# Patient Record
Sex: Male | Born: 1935 | Race: White | Hispanic: No | Smoking: Never smoker
Health system: Southern US, Community
[De-identification: ages and names within clinical notes are randomized; demographics above are authoritative.]

## PROBLEM LIST (undated history)

## (undated) DIAGNOSIS — I10 Essential (primary) hypertension: Secondary | ICD-10-CM

## (undated) DIAGNOSIS — E785 Hyperlipidemia, unspecified: Secondary | ICD-10-CM

## (undated) DIAGNOSIS — I255 Ischemic cardiomyopathy: Secondary | ICD-10-CM

## (undated) DIAGNOSIS — Z9289 Personal history of other medical treatment: Secondary | ICD-10-CM

## (undated) DIAGNOSIS — I2510 Atherosclerotic heart disease of native coronary artery without angina pectoris: Secondary | ICD-10-CM

## (undated) DIAGNOSIS — N189 Chronic kidney disease, unspecified: Secondary | ICD-10-CM

## (undated) DIAGNOSIS — D649 Anemia, unspecified: Secondary | ICD-10-CM

## (undated) DIAGNOSIS — M1990 Unspecified osteoarthritis, unspecified site: Secondary | ICD-10-CM

## (undated) DIAGNOSIS — R1310 Dysphagia, unspecified: Secondary | ICD-10-CM

## (undated) DIAGNOSIS — I340 Nonrheumatic mitral (valve) insufficiency: Secondary | ICD-10-CM

## (undated) DIAGNOSIS — R55 Syncope and collapse: Secondary | ICD-10-CM

## (undated) DIAGNOSIS — I351 Nonrheumatic aortic (valve) insufficiency: Secondary | ICD-10-CM

## (undated) HISTORY — DX: Ischemic cardiomyopathy: I25.5

## (undated) HISTORY — DX: Atherosclerotic heart disease of native coronary artery without angina pectoris: I25.10

## (undated) HISTORY — DX: Syncope and collapse: R55

## (undated) HISTORY — DX: Personal history of other medical treatment: Z92.89

## (undated) HISTORY — DX: Essential (primary) hypertension: I10

## (undated) HISTORY — DX: Anemia, unspecified: D64.9

## (undated) HISTORY — DX: Hyperlipidemia, unspecified: E78.5

## (undated) HISTORY — DX: Nonrheumatic mitral (valve) insufficiency: I34.0

## (undated) HISTORY — DX: Chronic kidney disease, unspecified: N18.9

## (undated) HISTORY — DX: Nonrheumatic aortic (valve) insufficiency: I35.1

## (undated) HISTORY — PX: INSERT / REPLACE / REMOVE PACEMAKER: SUR710

## (undated) HISTORY — DX: Unspecified osteoarthritis, unspecified site: M19.90

---

## 1978-11-16 HISTORY — PX: CHOLECYSTECTOMY: SHX55

## 1999-08-17 DIAGNOSIS — E785 Hyperlipidemia, unspecified: Secondary | ICD-10-CM

## 1999-08-17 HISTORY — DX: Hyperlipidemia, unspecified: E78.5

## 2001-09-16 DIAGNOSIS — M1990 Unspecified osteoarthritis, unspecified site: Secondary | ICD-10-CM

## 2001-09-16 HISTORY — DX: Unspecified osteoarthritis, unspecified site: M19.90

## 2002-10-16 DIAGNOSIS — I10 Essential (primary) hypertension: Secondary | ICD-10-CM

## 2002-10-16 HISTORY — DX: Essential (primary) hypertension: I10

## 2003-12-18 DIAGNOSIS — Z9289 Personal history of other medical treatment: Secondary | ICD-10-CM

## 2003-12-18 HISTORY — DX: Personal history of other medical treatment: Z92.89

## 2004-07-14 DIAGNOSIS — D649 Anemia, unspecified: Secondary | ICD-10-CM

## 2004-07-14 HISTORY — DX: Anemia, unspecified: D64.9

## 2004-09-25 ENCOUNTER — Ambulatory Visit: Payer: Self-pay | Admitting: Family Medicine

## 2004-10-07 ENCOUNTER — Ambulatory Visit: Payer: Self-pay | Admitting: Family Medicine

## 2004-11-27 ENCOUNTER — Ambulatory Visit: Payer: Self-pay | Admitting: Family Medicine

## 2004-12-17 LAB — HM COLONOSCOPY

## 2005-01-08 ENCOUNTER — Inpatient Hospital Stay: Payer: Self-pay | Admitting: Internal Medicine

## 2005-04-21 ENCOUNTER — Ambulatory Visit: Payer: Self-pay | Admitting: Family Medicine

## 2005-06-12 ENCOUNTER — Ambulatory Visit: Payer: Self-pay | Admitting: Family Medicine

## 2005-06-16 ENCOUNTER — Ambulatory Visit: Payer: Self-pay | Admitting: Family Medicine

## 2005-07-26 ENCOUNTER — Other Ambulatory Visit: Payer: Self-pay

## 2005-07-26 ENCOUNTER — Inpatient Hospital Stay: Payer: Self-pay | Admitting: Internal Medicine

## 2005-07-26 DIAGNOSIS — Z9289 Personal history of other medical treatment: Secondary | ICD-10-CM

## 2005-07-26 HISTORY — DX: Personal history of other medical treatment: Z92.89

## 2005-07-27 ENCOUNTER — Other Ambulatory Visit: Payer: Self-pay

## 2005-08-12 ENCOUNTER — Encounter: Payer: Self-pay | Admitting: Otolaryngology

## 2005-08-16 ENCOUNTER — Encounter: Payer: Self-pay | Admitting: Otolaryngology

## 2005-09-02 ENCOUNTER — Ambulatory Visit: Payer: Self-pay | Admitting: Family Medicine

## 2005-10-05 ENCOUNTER — Ambulatory Visit: Payer: Self-pay | Admitting: Family Medicine

## 2005-10-14 ENCOUNTER — Ambulatory Visit: Payer: Self-pay | Admitting: Family Medicine

## 2006-04-07 ENCOUNTER — Ambulatory Visit: Payer: Self-pay | Admitting: Family Medicine

## 2006-08-25 ENCOUNTER — Ambulatory Visit: Payer: Self-pay | Admitting: Family Medicine

## 2007-01-26 ENCOUNTER — Ambulatory Visit: Payer: Self-pay | Admitting: Internal Medicine

## 2007-08-26 ENCOUNTER — Ambulatory Visit: Payer: Self-pay | Admitting: Family Medicine

## 2007-11-03 ENCOUNTER — Telehealth (INDEPENDENT_AMBULATORY_CARE_PROVIDER_SITE_OTHER): Payer: Self-pay | Admitting: *Deleted

## 2008-08-21 ENCOUNTER — Ambulatory Visit: Payer: Self-pay | Admitting: Family Medicine

## 2008-10-24 ENCOUNTER — Ambulatory Visit: Payer: Self-pay | Admitting: Family Medicine

## 2008-10-24 DIAGNOSIS — Z8669 Personal history of other diseases of the nervous system and sense organs: Secondary | ICD-10-CM | POA: Insufficient documentation

## 2008-10-24 DIAGNOSIS — D239 Other benign neoplasm of skin, unspecified: Secondary | ICD-10-CM | POA: Insufficient documentation

## 2008-10-24 DIAGNOSIS — E785 Hyperlipidemia, unspecified: Secondary | ICD-10-CM | POA: Insufficient documentation

## 2008-10-24 DIAGNOSIS — R3129 Other microscopic hematuria: Secondary | ICD-10-CM | POA: Insufficient documentation

## 2008-10-24 DIAGNOSIS — D649 Anemia, unspecified: Secondary | ICD-10-CM | POA: Insufficient documentation

## 2008-10-24 DIAGNOSIS — I4510 Unspecified right bundle-branch block: Secondary | ICD-10-CM

## 2008-10-24 DIAGNOSIS — I10 Essential (primary) hypertension: Secondary | ICD-10-CM

## 2008-10-24 DIAGNOSIS — M1990 Unspecified osteoarthritis, unspecified site: Secondary | ICD-10-CM | POA: Insufficient documentation

## 2008-11-16 HISTORY — PX: CARDIAC CATHETERIZATION: SHX172

## 2008-12-11 ENCOUNTER — Inpatient Hospital Stay: Payer: Self-pay | Admitting: Internal Medicine

## 2008-12-11 ENCOUNTER — Ambulatory Visit: Payer: Self-pay | Admitting: Family

## 2008-12-17 HISTORY — PX: CARDIAC CATHETERIZATION: SHX172

## 2008-12-21 ENCOUNTER — Inpatient Hospital Stay: Payer: Self-pay | Admitting: Cardiovascular Disease

## 2009-02-12 ENCOUNTER — Ambulatory Visit: Payer: Self-pay | Admitting: Family

## 2009-03-20 ENCOUNTER — Ambulatory Visit: Payer: Self-pay | Admitting: Internal Medicine

## 2009-04-04 ENCOUNTER — Ambulatory Visit: Payer: Self-pay | Admitting: Internal Medicine

## 2009-04-05 ENCOUNTER — Other Ambulatory Visit: Payer: Self-pay | Admitting: Otolaryngology

## 2009-04-18 ENCOUNTER — Encounter: Payer: Self-pay | Admitting: Internal Medicine

## 2009-08-28 ENCOUNTER — Ambulatory Visit: Payer: Self-pay | Admitting: Family Medicine

## 2010-03-06 ENCOUNTER — Ambulatory Visit: Payer: Self-pay | Admitting: Family Medicine

## 2010-03-06 DIAGNOSIS — R197 Diarrhea, unspecified: Secondary | ICD-10-CM

## 2010-03-18 ENCOUNTER — Ambulatory Visit: Payer: Self-pay | Admitting: Family Medicine

## 2010-03-18 LAB — CONVERTED CEMR LAB
Alkaline Phosphatase: 75 units/L (ref 39–117)
Basophils Absolute: 0.1 10*3/uL (ref 0.0–0.1)
Bilirubin, Direct: 0.2 mg/dL (ref 0.0–0.3)
Calcium: 8.8 mg/dL (ref 8.4–10.5)
Eosinophils Relative: 1.8 % (ref 0.0–5.0)
Glucose, Bld: 89 mg/dL (ref 70–99)
HDL: 25.5 mg/dL — ABNORMAL LOW (ref >39.00)
Iron: 85 ug/dL (ref 42–165)
MCV: 94.8 fL (ref 78.0–100.0)
Magnesium: 2.1 mg/dL (ref 1.5–2.5)
Monocytes Absolute: 0.7 10*3/uL (ref 0.1–1.0)
Neutrophils Relative %: 65 % (ref 43.0–77.0)
Phosphorus: 3.7 mg/dL (ref 2.3–4.6)
Platelets: 148 10*3/uL — ABNORMAL LOW (ref 150.0–400.0)
RDW: 14.2 % (ref 11.5–14.6)
Sodium: 140 meq/L (ref 135–145)
Total CHOL/HDL Ratio: 4
VLDL: 18.6 mg/dL (ref 0.0–40.0)
Vitamin B-12: 454 pg/mL (ref 211–911)
WBC: 7.5 10*3/uL (ref 4.5–10.5)

## 2010-03-20 ENCOUNTER — Ambulatory Visit: Payer: Self-pay | Admitting: Family Medicine

## 2010-03-20 DIAGNOSIS — E559 Vitamin D deficiency, unspecified: Secondary | ICD-10-CM | POA: Insufficient documentation

## 2010-04-23 ENCOUNTER — Ambulatory Visit: Payer: Self-pay | Admitting: Family Medicine

## 2010-06-23 ENCOUNTER — Encounter (INDEPENDENT_AMBULATORY_CARE_PROVIDER_SITE_OTHER): Payer: Self-pay | Admitting: *Deleted

## 2010-08-19 ENCOUNTER — Encounter: Payer: Self-pay | Admitting: Family Medicine

## 2010-10-08 ENCOUNTER — Ambulatory Visit: Payer: Self-pay | Admitting: Family Medicine

## 2010-10-08 ENCOUNTER — Telehealth: Payer: Self-pay | Admitting: Family Medicine

## 2010-10-30 ENCOUNTER — Ambulatory Visit: Payer: Self-pay | Admitting: Family Medicine

## 2010-12-16 NOTE — Assessment & Plan Note (Signed)
Summary: SPLINTER/CLE   Vital Signs:  Patient profile:   77 year old male Weight:      156.75 pounds Temp:     97.4 degrees F oral Pulse rate:   64 / minute Pulse rhythm:   regular BP sitting:   126 / 78  (left arm) Cuff size:   regular  Vitals Entered By: Sydell Axon LPN (October 08, 2010 4:18 PM) CC: ? Remove splinter from ring finger on right hand   History of Present Illness: Pt here for poss splinter removal. He somehow got a splinter in the paranychial area of the lateral nail of the ring finger on his right hand. He pulled it out with a pair of tweezers and thought he got it all but his finger has been "sore as a boil", especially just proxiamal to the nail sulcus laterally. He has been applying triple antibiotic ointment.  Problems Prior to Update: 1)  Vitamin D Deficiency  (ICD-268.9) 2)  Diarrhea  (ICD-787.91) 3)  Rbbb  (ICD-426.4) 4)  Microscopic Hematuria  (ICD-599.72) 5)  Syncope, Hx of  (ICD-V12.49) 6)  Osteoarthritis  (ICD-715.90) 7)  Hypertension  (ICD-401.9) 8)  Hyperlipidemia  (ICD-272.4) 9)  Coronary Artery Disease  (ICD-414.00) 10)  Anemia-nos  (ICD-285.9) 11)  Nevi, Multiple  (ICD-216.9)  Medications Prior to Update: 1)  Multivitamins  Tabs (Multiple Vitamin) .Marland Kitchen.. 1 Daily  With Iron By Mouth 2)  Ferrous Sulfate 324 Mg Tbec (Ferrous Sulfate) .... Take 1 Tablet By Mouth Daily 3)  Plavix 75 Mg Tabs (Clopidogrel Bisulfate) .... Take One By Mouth Daily 4)  Simvastatin 20 Mg Tabs (Simvastatin) .... Take One By Mouth Daily 5)  Ibuprofen 200 Mg Tabs (Ibuprofen) .... As Needed 6)  Nadolol 20 Mg Tabs (Nadolol) .... One Tab By Mouth Daily.  Allergies: 1)  ! * Cortisone Tablets  Physical Exam  General:  Well-developed,well-nourished,in no acute distress; alert,appropriate and cooperative throughout examination, looks nontoxic and quite vigorous for his age. Noncongested but slightly hoarse. Extremities:  R ring finger swollen and erythematous and extremely  tender to touch. No fluctulance obvious. Area prepped and draped in usual sterile fashion, 2% Lido w/o epi used as local in digital block fashion. Small linear incision made along the lateral border of the ring finger nail and probed....small triangular piece of wood removed with forceps. Bactroban and sterile drsg placed. See instructions.   Impression & Recommendations:  Problem # 1:  FINGER SUP FB W/O MAJ OPEN WOUND&W/O MENTION INF (ICD-915.6) Assessment New FB removed. Soak aggressively.  Complete Medication List: 1)  Multivitamins Tabs (Multiple vitamin) .Marland Kitchen.. 1 daily  with iron by mouth 2)  Ferrous Sulfate 324 Mg Tbec (Ferrous sulfate) .... Take 1 tablet by mouth daily 3)  Plavix 75 Mg Tabs (Clopidogrel bisulfate) .... Take one by mouth daily 4)  Simvastatin 20 Mg Tabs (Simvastatin) .... Take one by mouth daily 5)  Ibuprofen 200 Mg Tabs (Ibuprofen) .... As needed 6)  Nadolol 20 Mg Tabs (Nadolol) .... One tab by mouth daily.  Patient Instructions: 1)  At 0645, take bandaid off and soak finger in hot water...total of three cupsful every hour for tonite and when first gets up in the AM, then 2-3 times more tommorrow. Keep Neosporin on it when not soaking, band aid tonite and tomm. 2)  30 mins spent w/ pt.   Orders Added: 1)  Est. Patient Level IV [16109]    Current Allergies (reviewed today): ! * CORTISONE TABLETS

## 2010-12-16 NOTE — Letter (Signed)
Summary: Nadara Eaton letter  [place] at [facility]  [address] [place], [place] [address]   Phone: [phone]  Fax: [phone]       06/23/2010 MRN: [id number]  [facility] [address] [place], [place]  [address]  Dear Mr. Krieger,  [facility] [place] and [place] announce the retirement of Arta Silence, M.D., from full-time practice at the [facility] office effective May 15, 2010 and his plans of returning part-time.  It is important to Dr. Hetty Ely and to our practice that you understand that [facility] has seven physicians in our office for your health care needs.  We will continue to offer the same exceptional care that you have today.    Dr. Hetty Ely has spoken to many of you about his plans for retirement and returning part-time in the fall.   We will continue to work with you through the transition to schedule appointments for you in the office and meet the high standards that [place] is committed to.   Again, it is with great pleasure that we share the news that Dr. Hetty Ely will return to [facility] at [facility] in October of 2011 with a reduced schedule.    If you have any questions, or would like to request an appointment with one of our physicians, please call us at [phone] and press the option for Scheduling an appointment.  We take pleasure in providing you with excellent patient care and look forward to seeing you at your next office visit.  Our [facility] Physicians are:  Tillman Abide, M.D. Laurita Quint, M.D. Roxy Manns, M.D. Kerby Nora, M.D. Hannah Beat, M.D. Ruthe Mannan, M.D. We proudly welcomed Raechel Ache, M.D. and Eustaquio Boyden, M.D. to the practice in July/August 2011.  Sincerely,  [facility]

## 2010-12-16 NOTE — Assessment & Plan Note (Signed)
Summary: diarrhea/dlo   Vital Signs:  Patient profile:   77 year old male Height:      69 inches Weight:      159.50 pounds Temp:     97.8 degrees F oral Pulse rate:   80 / minute Pulse rhythm:   regular BP sitting:   140 / 70  (left arm) Cuff size:   regular  Vitals Entered By: Sydell Axon LPN (March 06, 2010 9:53 AM) CC: diarrhea   History of Present Illness: Pt here with son for diarrhea. He has had diarrhea for one month, his diarrhea sometimes messes his underpants:  sometimes in the bathroom, he has "a small explosion."  Sometimes after getting up he can tell he has lost some stool. This started about a month ago. His meds were changed 6 months ago and didn't have these sxs until about one month ago. The sxs seem to be more with changing position from sitting down and then getting up. He thinks his sphincter muscle is weak. He was given a script for diarrhea in 2/10 for Loperamide when he was discharged from the hospital for dehydration after readmission from coronary disease.  The last time he had diiarrhea he was given immodium. This fact tells me he has had problems with diarrhea for some time and I think the incontinence is really the issue. This could indeed be that his diarrhea has worsened. His diet is like [employer] coffee with cream and sugar, sweet tea in the day, cold cereal , occas egg...basically [employer] food for brfst, general food for lunch and light intake of various foods for supper. He does not feel lightheaded, has had no nausea or vomitting and has had no fever. He has not traveled anywhere recently but is scheduled to take a cruise next month.  Problems Prior to Update: 1)  Rbbb  (ICD-426.4) 2)  Microscopic Hematuria  (ICD-599.72) 3)  Syncope, Hx of  (ICD-V12.49) 4)  Osteoarthritis  (ICD-715.90) 5)  Hypertension  (ICD-401.9) 6)  Hyperlipidemia  (ICD-272.4) 7)  Coronary Artery Disease  (ICD-414.00) 8)  Anemia-nos  (ICD-285.9) 9)  Nevi, Multiple   (ICD-216.9)  Medications Prior to Update: 1)  Norvasc 2.5 Mg Tabs (Amlodipine Besylate) .... 1/2 Tablet Daily By Mouth 2)  Multivitamins  Tabs (Multiple Vitamin) .Marland Kitchen.. 1 Daily  With Iron By Mouth 3)  Calcium .... 300mg  Daily By Mouth 4)  Bayer Low Strength 81 Mg Tbec (Aspirin) .Marland Kitchen.. 1 Daily By Mouth 5)  Ferrous Sulfate 324 Mg Tbec (Ferrous Sulfate) .... Take 1 Tablet By Mouth Three Times A Day ???  Allergies: 1)  ! * Cortisone Tablets  Physical Exam  General:  Well-developed,well-nourished,in no acute distress; alert,appropriate and cooperative throughout examination, looks nontoxic and quite vigorous for his age. Head:  Normocephalic and atraumatic without obvious abnormalities.  Eyes:  Conjunctiva clear bilaterally.  Ears:  External ear exam shows no significant lesions or deformities.  Otoscopic examination reveals clear canals, tympanic membranes are intact bilaterally without bulging, retraction, inflammation or discharge. Hearing is grossly normal bilaterally. Nose:  External nasal examination shows no deformity or inflammation. Nasal mucosa are pink and moist without lesions or exudates. Mouth:  Oral mucosa and oropharynx without lesions or exudates.  Teeth in good repair. Neck:  No deformities, masses, or tenderness noted. Lungs:  Normal respiratory effort, chest expands symmetrically. Lungs are clear to auscultation, no crackles or wheezes. Heart:  Normal rate and regular rhythm. S1 and S2 normal without gallop, murmur, click, rub or other extra  sounds. Abdomen:  Bowel sounds positive,abdomen soft and non-tender without masses, organomegaly or hernias noted. Minimal tympany centrally.   Impression & Recommendations:  Problem # 1:  DIARRHEA (ICD-787.91) Assessment New  Seems to be longstanding although new problem tomy knowledge. Does not seem infectious.  Suggest he try avoiding milk andf milk products for a few weeks.  Start Citrucel, 1 tsp i 4 oz of water. Cont fluid  hydration. Will check electrolytes in the future.  Discussed symptom control and diet. Call if worsening of symptoms or signs of dehydration.   Problem # 2:  HYPERTENSION (ICD-401.9) Assessment: Deteriorated Need to follow. Will recheck next visit. The following medications were removed from the medication list:    Norvasc 2.5 Mg Tabs (Amlodipine besylate) .Marland Kitchen... 1/2 tablet daily by mouth  BP today: 140/70 Prior BP: 130/60 (10/24/2008)  Complete Medication List: 1)  Multivitamins Tabs (Multiple vitamin) .Marland Kitchen.. 1 daily  with iron by mouth 2)  Ferrous Sulfate 324 Mg Tbec (Ferrous sulfate) .... Take 1 tablet by mouth three times a day ??? 3)  Plavix 75 Mg Tabs (Clopidogrel bisulfate) .... Take one by mouth daily 4)  Simvastatin 20 Mg Tabs (Simvastatin) .... Take one by mouth daily  Patient Instructions: 1)  RTC 2 weeks, labs prior. 2)  25 mins spent with pt and son.  Current Allergies (reviewed today): ! * CORTISONE TABLETS

## 2010-12-16 NOTE — Assessment & Plan Note (Signed)
Summary: FLU VACCINE  Nurse Visit   Allergies: 1)  ! * Cortisone Tablets  Orders Added: 1)  Flu Vaccine 38yrs + MEDICARE PATIENTS [Q2039] 2)  Administration Flu vaccine - MCR [G0008]  Flu Vaccine Consent Questions     Do you have a history of severe allergic reactions to this vaccine? no    Any prior history of allergic reactions to egg and/or gelatin? no    Do you have a sensitivity to the preservative Thimersol? no    Do you have a past history of Guillan-Barre Syndrome? no    Do you currently have an acute febrile illness? no    Have you ever had a severe reaction to latex? no    Vaccine information given and explained to patient? yes    Are you currently pregnant? no    Lot Number:[id number]   Exp Date:05/16/2011   Site Given  Left Deltoid IM

## 2010-12-16 NOTE — Progress Notes (Signed)
Summary: splinter in finger  Phone Note Call from Patient   Caller: Patient Call For: Shaune Leeks MD Summary of Call: Pt states he got a splinter in his finger 2 weeks ago and it is red and hurting.  He has been putting various creams and salves on it but nothing is helping.  Because there are no appts available today I [facility] pt [facility]. Initial call taken by: Lowella Petties CMA, AAMA,  October 08, 2010 11:13 AM  Follow-up for Phone Call        Told him to be here at 415. Follow-up by: Shaune Leeks MD,  October 08, 2010 11:32 AM

## 2010-12-16 NOTE — Assessment & Plan Note (Signed)
Summary: ? SINUS INFECTION   Vital Signs:  Patient profile:   77 year old male Weight:      152.75 pounds Temp:     98.4 degrees F oral Pulse rate:   80 / minute Pulse rhythm:   regular BP sitting:   124 / 70  (left arm) Cuff size:   regular  Vitals Entered By: Sydell Axon LPN (April 23, 1609 10:28 AM) CC: ? Sinus infection, has sinus pain and nose bleeds   History of Present Illness: Pt here with son for Sinus problems. He has no fever or chills, He had fronbtal headache which is now resolved. He started with ST last night and gargled with peroxide. He has frontal/max congestion currently. He had rhinitis and bleeding from left, otherwise clear, yellow toward the end. Then had discharge and bleeding which then quit. Hehda ST , now gone. He has minimal cough, nonproductive.  On Sat he got allergy medication Chlorpheniramine which he stopped quickly.  He has taken IBP, he took one dose of tyl and it didn't work.  Problems Prior to Update: 1)  Vitamin D Deficiency  (ICD-268.9) 2)  Diarrhea  (ICD-787.91) 3)  Rbbb  (ICD-426.4) 4)  Microscopic Hematuria  (ICD-599.72) 5)  Syncope, Hx of  (ICD-V12.49) 6)  Osteoarthritis  (ICD-715.90) 7)  Hypertension  (ICD-401.9) 8)  Hyperlipidemia  (ICD-272.4) 9)  Coronary Artery Disease  (ICD-414.00) 10)  Anemia-nos  (ICD-285.9) 11)  Nevi, Multiple  (ICD-216.9)  Medications Prior to Update: 1)  Multivitamins  Tabs (Multiple Vitamin) .Marland Kitchen.. 1 Daily  With Iron By Mouth 2)  Ferrous Sulfate 324 Mg Tbec (Ferrous Sulfate) .... Take 1 Tablet By Mouth Three Times A Day ??? 3)  Plavix 75 Mg Tabs (Clopidogrel Bisulfate) .... Take One By Mouth Daily 4)  Simvastatin 20 Mg Tabs (Simvastatin) .... Take One By Mouth Daily  Allergies: 1)  ! * Cortisone Tablets  Physical Exam  General:  Well-developed,well-nourished,in no acute distress; alert,appropriate and cooperative throughout examination, looks nontoxic and quite vigorous for his age. Noncongested but  slightly hoarse. Head:  Normocephalic and atraumatic without obvious abnormalities. Sinuses NT. Eyes:  Conjunctiva clear bilaterally.  Ears:  External ear exam shows no significant lesions or deformities.  Otoscopic examination reveals clear canals, tympanic membranes are intact bilaterally without bulging, retraction, inflammation or discharge. Hearing is grossly normal bilaterally. ril. Nose:  External nasal examination shows no deformity or inflammation. Nasal mucosa are pink and moist without lesions or exudates. Nidus of bleeding right ant nostril. Mouth:  Oral mucosa and oropharynx without lesions or exudates.  Teeth in good repair. Neck:  No deformities, masses, or tenderness noted. Lungs:  Normal respiratory effort, chest expands symmetrically. Lungs are clear to auscultation, no crackles or wheezes. Heart:  Normal rate and regular rhythm. S1 and S2 normal without gallop, murmur, click, rub or other extra sounds.   Impression & Recommendations:  Problem # 1:  URI (ICD-465.9) Assessment New  See instructions. His updated medication list for this problem includes:    Ibuprofen 200 Mg Tabs (Ibuprofen) .Marland Kitchen... As needed  Instructed on symptomatic treatment. Call if symptoms persist or worsen.   Problem # 2:  RBBB (ICD-426.4) Is on Nadolol perr Dr Juel Burrow since last CAD diagnosis ?last year.  Complete Medication List: 1)  Multivitamins Tabs (Multiple vitamin) .Marland Kitchen.. 1 daily  with iron by mouth 2)  Ferrous Sulfate 324 Mg Tbec (Ferrous sulfate) .... Take 1 tablet by mouth daily 3)  Plavix 75 Mg Tabs (Clopidogrel bisulfate) .Marland KitchenMarland KitchenMarland Kitchen  Take one by mouth daily 4)  Simvastatin 20 Mg Tabs (Simvastatin) .... Take one by mouth daily 5)  Ibuprofen 200 Mg Tabs (Ibuprofen) .... As needed 6)  Nadolol 20 Mg Tabs (Nadolol) .... One tab by mouth daily.  Patient Instructions: 1)  Take Guaifenesin by going to [facility] or [facility] and getting MUCOUS RELIEF EXPECTORANT (400mg ), take 11/2 tabs by  mouth AM and NOON. 2)  Drink lots of fluids anytime taking Guaifenesin.  3)  Take Tyl regularly for a while.  Current Allergies (reviewed today): ! * CORTISONE TABLETS

## 2010-12-16 NOTE — Assessment & Plan Note (Signed)
Summary: ROA 2 WEEKS CYD   Vital Signs:  Patient profile:   77 year old male Weight:      160.75 pounds BMI:     23.82 Temp:     98.1 degrees F oral Pulse rate:   80 / minute Pulse rhythm:   regular BP sitting:   120 / 70  (left arm) Cuff size:   regular  Vitals Entered By: Sydell Axon LPN (Mar 20, 453 11:49 AM) CC: 2 Week follow-up   History of Present Illness: Pt here for followup of diarrhea. He has been getting along real well. He stopped Benefiber on Sun because he was back to normal. The next day he ate at [employer] and then had one episode of diarrhea. He is again back to normal today. He feels reaonably well and except for his memory he thinks he is getting along great.  Problems Prior to Update: 1)  Vitamin D Deficiency  (ICD-268.9) 2)  Diarrhea  (ICD-787.91) 3)  Rbbb  (ICD-426.4) 4)  Microscopic Hematuria  (ICD-599.72) 5)  Syncope, Hx of  (ICD-V12.49) 6)  Osteoarthritis  (ICD-715.90) 7)  Hypertension  (ICD-401.9) 8)  Hyperlipidemia  (ICD-272.4) 9)  Coronary Artery Disease  (ICD-414.00) 10)  Anemia-nos  (ICD-285.9) 11)  Nevi, Multiple  (ICD-216.9)  Medications Prior to Update: 1)  Multivitamins  Tabs (Multiple Vitamin) .Marland Kitchen.. 1 Daily  With Iron By Mouth 2)  Ferrous Sulfate 324 Mg Tbec (Ferrous Sulfate) .... Take 1 Tablet By Mouth Three Times A Day ??? 3)  Plavix 75 Mg Tabs (Clopidogrel Bisulfate) .... Take One By Mouth Daily 4)  Simvastatin 20 Mg Tabs (Simvastatin) .... Take One By Mouth Daily  Allergies: 1)  ! * Cortisone Tablets  Physical Exam  General:  Well-developed,well-nourished,in no acute distress; alert,appropriate and cooperative throughout examination, looks nontoxic and quite vigorous for his age. Head:  Normocephalic and atraumatic without obvious abnormalities. Sinuses NT. Eyes:  Conjunctiva clear bilaterally.  Ears:  External ear exam shows no significant lesions or deformities.  Otoscopic examination reveals clear canals, tympanic membranes are  intact bilaterally without bulging, retraction, inflammation or discharge. Hearing is grossly normal bilaterally. Nose:  External nasal examination shows no deformity or inflammation. Nasal mucosa are pink and moist without lesions or exudates. Mouth:  Oral mucosa and oropharynx without lesions or exudates.  Teeth in good repair. Neck:  No deformities, masses, or tenderness noted. Lungs:  Normal respiratory effort, chest expands symmetrically. Lungs are clear to auscultation, no crackles or wheezes. Heart:  Normal rate and regular rhythm. S1 and S2 normal without gallop, murmur, click, rub or other extra sounds. Abdomen:  Bowel sounds positive,abdomen soft and non-tender without masses, organomegaly or hernias noted. Tympany improved..   Impression & Recommendations:  Problem # 1:  DIARRHEA (ICD-787.91) Assessment Improved  Cont Benefiber indefinitely. Avoid apple jiuice, apples and milk products.  Discussed symptom control and diet. Call if worsening of symptoms or signs of dehydration.   Problem # 2:  MICROSCOPIC HEMATURIA (ICD-599.72) Assessment: Unchanged  CBC and related vityamins nml, appears to have mild myelodysplasia. Is stable so no need for Aranesp at this point.  Problem # 3:  HYPERTENSION (ICD-401.9) Assessment: Unchanged  Stable. Cont as is.  BP today: 120/70 Prior BP: 140/70 (03/06/2010)  Labs Reviewed: K+: 5.0 (03/18/2010) Creat: : 1.8 (03/18/2010)   Chol: 101 (03/18/2010)   HDL: 25.50 (03/18/2010)   LDL: 57 (03/18/2010)   TG: 93.0 (03/18/2010)  Problem # 4:  HYPERLIPIDEMIA (ICD-272.4) Assessment: Unchanged  Great control  except HDL. More walking would help. His updated medication list for this problem includes:    Simvastatin 20 Mg Tabs (Simvastatin) .Marland Kitchen... Take one by mouth daily  Labs Reviewed: SGOT: 31 (03/18/2010)   SGPT: 19 (03/18/2010)   HDL:25.50 (03/18/2010)  LDL:57 (03/18/2010)  Chol:101 (03/18/2010)  Trig:93.0 (03/18/2010)  Problem # 5:   ANEMIA-NOS (ICD-285.9) Assessment: Unchanged  Stable. Early Myelodyplasia . His updated medication list for this problem includes:    Ferrous Sulfate 324 Mg Tbec (Ferrous sulfate) .Marland Kitchen... Take 1 tablet by mouth three times a day ???  Hgb: 12.3 (03/18/2010)   Hct: 35.9 (03/18/2010)   Platelets: 148.0 (03/18/2010) RBC: 3.79 (03/18/2010)   RDW: 14.2 (03/18/2010)   WBC: 7.5 (03/18/2010) MCV: 94.8 (03/18/2010)   MCHC: 34.2 (03/18/2010) Iron: 85 (03/18/2010)   B12: 454 (03/18/2010)   TSH: 2.12 (03/18/2010)  Problem # 6:  VITAMIN D DEFICIENCY (ICD-268.9) Assessment: New Start Vit D repl, 1000Iu OTC two times a day indefinitely.  Complete Medication List: 1)  Multivitamins Tabs (Multiple vitamin) .Marland Kitchen.. 1 daily  with iron by mouth 2)  Ferrous Sulfate 324 Mg Tbec (Ferrous sulfate) .... Take 1 tablet by mouth three times a day ??? 3)  Plavix 75 Mg Tabs (Clopidogrel bisulfate) .... Take one by mouth daily 4)  Simvastatin 20 Mg Tabs (Simvastatin) .... Take one by mouth daily  Other Orders: TD Toxoids IM 7 YR + (16109) Admin 1st Vaccine (60454)  Patient Instructions: 1)  RTC as needed. 2)  Td today.  Current Allergies (reviewed today): ! * CORTISONE TABLETS   Tetanus/Td Vaccine    Vaccine Type: Td    Site: left deltoid    Mfr: Sanofi Pasteur    Dose: 0.5 ml    Route: IM    Given by: Sydell Axon LPN    Exp. Date: 10/01/2011    Lot #: [id number]    VIS given: 10/04/07 version given Mar 20, 2010.

## 2010-12-18 NOTE — Assessment & Plan Note (Signed)
Summary: DIARRHEA/CLE   Vital Signs:  Patient profile:   77 year old male Weight:      157.25 pounds Temp:     97.4 degrees F oral Pulse rate:   72 / minute Pulse rhythm:   regular BP sitting:   120 / 74  (left arm) Cuff size:   regular  Vitals Entered By: Sydell Axon LPN (October 30, 2010 8:55 AM) CC: Diarrhea and low back pain when getting up and down, Hypertension Management   History of Present Illness: Pt having diarrhea with loss of stool when not desired. The stool itself is ok. He thinks the generic Plavix is the culprit. When he took it after brfst he would have leakage before lunch. When he takes it after lunch, he then has leakage in the afternoon. He has been on generic Plavix since donut hole time last year. He gets his Plavix from [place] which originates it from [place] last time.   Hypertension History:      Positive major cardiovascular risk factors include male age 45 years old or older, hyperlipidemia, and hypertension.  Negative major cardiovascular risk factors include non-tobacco-user status.        Positive history for target organ damage include ASHD (either angina/prior MI/prior CABG).     Problems Prior to Update: 1)  Vitamin D Deficiency  (ICD-268.9) 2)  Diarrhea  (ICD-787.91) 3)  Rbbb  (ICD-426.4) 4)  Microscopic Hematuria  (ICD-599.72) 5)  Syncope, Hx of  (ICD-V12.49) 6)  Osteoarthritis  (ICD-715.90) 7)  Hypertension  (ICD-401.9) 8)  Hyperlipidemia  (ICD-272.4) 9)  Coronary Artery Disease  (ICD-414.00) 10)  Anemia-nos  (ICD-285.9) 11)  Nevi, Multiple  (ICD-216.9)  Medications Prior to Update: 1)  Multivitamins  Tabs (Multiple Vitamin) .Marland Kitchen.. 1 Daily  With Iron By Mouth 2)  Ferrous Sulfate 324 Mg Tbec (Ferrous Sulfate) .... Take 1 Tablet By Mouth Daily 3)  Plavix 75 Mg Tabs (Clopidogrel Bisulfate) .... Take One By Mouth Daily 4)  Simvastatin 20 Mg Tabs (Simvastatin) .... Take One By Mouth Daily 5)  Ibuprofen 200 Mg Tabs (Ibuprofen) .... As Needed 6)   Nadolol 20 Mg Tabs (Nadolol) .... One Tab By Mouth Daily.  Allergies: 1)  ! * Cortisone Tablets  Physical Exam  General:  Well-developed,well-nourished,in no acute distress; alert,appropriate and cooperative throughout examination, looks nontoxic and quite vigorous for his age. Noncongested but slightly hoarse. Head:  Normocephalic and atraumatic without obvious abnormalities. Sinuses NT. Eyes:  Conjunctiva clear bilaterally.  Ears:  External ear exam shows no significant lesions or deformities.  Otoscopic examination reveals clear canals, tympanic membranes are intact bilaterally without bulging, retraction, inflammation or discharge. Hearing is grossly normal bilaterally. ril. Nose:  External nasal examination shows no deformity or inflammation. Nasal mucosa are pink and moist without lesions or exudates.  Mouth:  Oral mucosa and oropharynx without lesions or exudates.  Teeth in good repair. Neck:  No deformities, masses, or tenderness noted. Lungs:  Normal respiratory effort, chest expands symmetrically. Lungs are clear to auscultation, no crackles or wheezes. Heart:  Normal rate and regular rhythm. S1 and S2 normal without gallop, murmur, click, rub or other extra sounds.   Impression & Recommendations:  Problem # 1:  DIARRHEA (ICD-787.91) Assessment Unchanged Stools seem ok to him but has leakage. Will try going back to  Branded Plavix and see if problem resolves. Pt thinks the generic Plavix is to blame...see HPI. May be Plavix in general. May be something else. Diarrhea and incontinence of stool not listed  in PDR. May need GI referral.  Complete Medication List: 1)  Multivitamins Tabs (Multiple vitamin) .Marland Kitchen.. 1 daily  with iron by mouth 2)  Ferrous Sulfate 324 Mg Tbec (Ferrous sulfate) .... Take 1 tablet by mouth daily 3)  Plavix 75 Mg Tabs (Clopidogrel bisulfate) .... Take one by mouth daily 4)  Simvastatin 20 Mg Tabs (Simvastatin) .... Take one by mouth daily 5)  Ibuprofen 200  Mg Tabs (Ibuprofen) .... As needed 6)  Nadolol 20 Mg Tabs (Nadolol) .... One tab by mouth daily. 7)  Acid Reducer Maximum Strength 150 Mg Tabs (Ranitidine hcl) .... Take one by mouth daily as needed 8)  [employer] Gummy Dinos Chew (Pediatric multivit-minerals-c) .... Chew one by mouth daily  Hypertension Assessment/Plan:      The patient's hypertensive risk group is category C: Target organ damage and/or diabetes.  Today's blood pressure is 120/74.    Patient Instructions: 1)  Call if sxs don't resolve. Prescriptions: PLAVIX 75 MG TABS (CLOPIDOGREL BISULFATE) Take one by mouth daily Brand medically necessary #30 x 12   Entered and Authorized by:   Shaune Leeks MD   Signed by:   Sydell Axon LPN on 16/08/9603   Method used:   Print then Give to Patient   RxID:   [id number] PLAVIX 75 MG TABS (CLOPIDOGREL BISULFATE) Take one by mouth daily Brand medically necessary #30 x 12   Entered and Authorized by:   Shaune Leeks MD   Signed by:   Shaune Leeks MD on 10/30/2010   Method used:   Print then Give to Patient   RxID:   [id number]    Orders Added: 1)  Est. Patient Level III [28413]    Current Allergies (reviewed today): ! * CORTISONE TABLETS  Appended Document: DIARRHEA/CLE Pt OBTW c/o low back pain. Discussed approach and trmt.

## 2011-02-06 ENCOUNTER — Other Ambulatory Visit: Payer: Self-pay | Admitting: Internal Medicine

## 2011-02-07 ENCOUNTER — Encounter: Payer: Self-pay | Admitting: Family Medicine

## 2011-02-12 ENCOUNTER — Ambulatory Visit: Payer: Self-pay | Admitting: Family Medicine

## 2011-09-01 ENCOUNTER — Ambulatory Visit: Payer: Self-pay

## 2011-11-23 ENCOUNTER — Ambulatory Visit: Payer: Self-pay | Admitting: Family Medicine

## 2012-04-30 ENCOUNTER — Emergency Department: Payer: Self-pay | Admitting: Emergency Medicine

## 2012-04-30 LAB — COMPREHENSIVE METABOLIC PANEL
Albumin: 3.6 g/dL (ref 3.4–5.0)
BUN: 35 mg/dL — ABNORMAL HIGH (ref 7–18)
Bilirubin,Total: 0.4 mg/dL (ref 0.2–1.0)
Calcium, Total: 8.2 mg/dL — ABNORMAL LOW (ref 8.5–10.1)
Chloride: 109 mmol/L — ABNORMAL HIGH (ref 98–107)
Co2: 24 mmol/L (ref 21–32)
Creatinine: 2.12 mg/dL — ABNORMAL HIGH (ref 0.60–1.30)
EGFR (Non-African Amer.): 26 — ABNORMAL LOW
Glucose: 85 mg/dL (ref 65–99)
Osmolality: 288 (ref 275–301)
Potassium: 4.7 mmol/L (ref 3.5–5.1)
SGPT (ALT): 24 U/L
Sodium: 141 mmol/L (ref 136–145)

## 2012-04-30 LAB — CBC
HCT: 28.5 % — ABNORMAL LOW (ref 40.0–52.0)
HGB: 9.1 g/dL — ABNORMAL LOW (ref 13.0–18.0)
MCHC: 31.8 g/dL — ABNORMAL LOW (ref 32.0–36.0)
MCV: 98 fL (ref 80–100)
Platelet: 129 10*3/uL — ABNORMAL LOW (ref 150–440)
RBC: 2.91 10*6/uL — ABNORMAL LOW (ref 4.40–5.90)
WBC: 9.1 10*3/uL (ref 3.8–10.6)

## 2012-04-30 LAB — CK TOTAL AND CKMB (NOT AT ARMC)
CK, Total: 236 U/L — ABNORMAL HIGH (ref 35–232)
CK-MB: 5.7 ng/mL — ABNORMAL HIGH (ref 0.5–3.6)

## 2012-04-30 LAB — TROPONIN I: Troponin-I: 0.02 ng/mL

## 2013-08-02 ENCOUNTER — Observation Stay: Payer: Self-pay | Admitting: Internal Medicine

## 2013-08-02 LAB — CBC WITH DIFFERENTIAL/PLATELET
Basophil #: 0.1 10*3/uL (ref 0.0–0.1)
Basophil %: 0.8 %
Eosinophil #: 0.2 10*3/uL (ref 0.0–0.7)
Eosinophil %: 2.6 %
HCT: 29.8 % — ABNORMAL LOW (ref 40.0–52.0)
HGB: 9.9 g/dL — ABNORMAL LOW (ref 13.0–18.0)
Lymphocyte #: 1.5 10*3/uL (ref 1.0–3.6)
MCH: 30.7 pg (ref 26.0–34.0)
MCHC: 33.3 g/dL (ref 32.0–36.0)
MCV: 92 fL (ref 80–100)
Monocyte #: 0.8 x10 3/mm (ref 0.2–1.0)
Monocyte %: 11 %
Neutrophil #: 5 10*3/uL (ref 1.4–6.5)
Platelet: 168 10*3/uL (ref 150–440)
RBC: 3.23 10*6/uL — ABNORMAL LOW (ref 4.40–5.90)
WBC: 7.5 10*3/uL (ref 3.8–10.6)

## 2013-08-02 LAB — URINALYSIS, COMPLETE
Bacteria: NONE SEEN
Bilirubin,UR: NEGATIVE
Glucose,UR: NEGATIVE mg/dL (ref 0–75)
Ketone: NEGATIVE
Leukocyte Esterase: NEGATIVE
Protein: 25
Squamous Epithelial: <1
WBC UR: <1 /HPF (ref 0–5)

## 2013-08-02 LAB — COMPREHENSIVE METABOLIC PANEL
Albumin: 3.4 g/dL (ref 3.4–5.0)
Alkaline Phosphatase: 83 U/L (ref 50–136)
Anion Gap: 5 — ABNORMAL LOW (ref 7–16)
Osmolality: 279 (ref 275–301)
Potassium: 5.2 mmol/L — ABNORMAL HIGH (ref 3.5–5.1)
SGPT (ALT): 27 U/L (ref 12–78)
Total Protein: 7.5 g/dL (ref 6.4–8.2)

## 2013-08-02 LAB — APTT: Activated PTT: 39.3 secs — ABNORMAL HIGH (ref 23.6–35.9)

## 2013-08-02 LAB — PROTIME-INR
INR: 1.1
Prothrombin Time: 14 secs (ref 11.5–14.7)

## 2013-08-02 LAB — TROPONIN I: Troponin-I: <0.02 ng/mL

## 2013-08-03 LAB — BASIC METABOLIC PANEL
BUN: 29 mg/dL — ABNORMAL HIGH (ref 7–18)
Calcium, Total: 8.4 mg/dL — ABNORMAL LOW (ref 8.5–10.1)
Chloride: 111 mmol/L — ABNORMAL HIGH (ref 98–107)
Creatinine: 2.1 mg/dL — ABNORMAL HIGH (ref 0.60–1.30)
EGFR (Non-African Amer.): 27 — ABNORMAL LOW
Osmolality: 285 (ref 275–301)
Potassium: 5 mmol/L (ref 3.5–5.1)
Sodium: 140 mmol/L (ref 136–145)

## 2013-08-03 LAB — CBC WITH DIFFERENTIAL/PLATELET
Basophil %: 0.8 %
Eosinophil %: 3 %
HCT: 26.7 % — ABNORMAL LOW (ref 40.0–52.0)
HGB: 9 g/dL — ABNORMAL LOW (ref 13.0–18.0)
Lymphocyte #: 1.2 10*3/uL (ref 1.0–3.6)
Lymphocyte %: 15.1 %
MCHC: 33.6 g/dL (ref 32.0–36.0)
MCV: 92 fL (ref 80–100)
Monocyte #: 0.8 x10 3/mm (ref 0.2–1.0)
Monocyte %: 10.3 %
Neutrophil #: 5.5 10*3/uL (ref 1.4–6.5)
Neutrophil %: 70.8 %
Platelet: 152 10*3/uL (ref 150–440)
RDW: 13.2 % (ref 11.5–14.5)
WBC: 7.7 10*3/uL (ref 3.8–10.6)

## 2013-08-05 LAB — CBC WITH DIFFERENTIAL/PLATELET
Basophil #: 0 10*3/uL (ref 0.0–0.1)
Basophil %: 0.7 %
HCT: 25.1 % — ABNORMAL LOW (ref 40.0–52.0)
HGB: 8.4 g/dL — ABNORMAL LOW (ref 13.0–18.0)
MCH: 30.8 pg (ref 26.0–34.0)
MCHC: 33.4 g/dL (ref 32.0–36.0)
Monocyte %: 14.2 %
Neutrophil #: 4.5 10*3/uL (ref 1.4–6.5)
RDW: 13.2 % (ref 11.5–14.5)
WBC: 7.1 10*3/uL (ref 3.8–10.6)

## 2014-04-16 ENCOUNTER — Inpatient Hospital Stay: Payer: Self-pay | Admitting: Internal Medicine

## 2014-04-16 ENCOUNTER — Ambulatory Visit: Payer: Self-pay | Admitting: Neurology

## 2014-04-16 DIAGNOSIS — R55 Syncope and collapse: Secondary | ICD-10-CM

## 2014-04-16 DIAGNOSIS — I359 Nonrheumatic aortic valve disorder, unspecified: Secondary | ICD-10-CM

## 2014-04-16 DIAGNOSIS — R197 Diarrhea, unspecified: Secondary | ICD-10-CM

## 2014-04-16 DIAGNOSIS — N179 Acute kidney failure, unspecified: Secondary | ICD-10-CM

## 2014-04-16 DIAGNOSIS — N189 Chronic kidney disease, unspecified: Secondary | ICD-10-CM

## 2014-04-16 LAB — CBC WITH DIFFERENTIAL/PLATELET
Basophil #: 0 10*3/uL (ref 0.0–0.1)
Basophil %: 0.1 %
EOS PCT: 0 %
Eosinophil #: 0 10*3/uL (ref 0.0–0.7)
HCT: 28 % — ABNORMAL LOW (ref 40.0–52.0)
HGB: 9.2 g/dL — AB (ref 13.0–18.0)
LYMPHS PCT: 11.1 %
Lymphocyte #: 1.1 10*3/uL (ref 1.0–3.6)
MCH: 31.4 pg (ref 26.0–34.0)
MCHC: 32.8 g/dL (ref 32.0–36.0)
MCV: 96 fL (ref 80–100)
Monocyte #: 1 x10 3/mm (ref 0.2–1.0)
Monocyte %: 9.7 %
NEUTROS PCT: 79.1 %
Neutrophil #: 8.2 10*3/uL — ABNORMAL HIGH (ref 1.4–6.5)
Platelet: 126 10*3/uL — ABNORMAL LOW (ref 150–440)
RBC: 2.92 10*6/uL — ABNORMAL LOW (ref 4.40–5.90)
RDW: 13.2 % (ref 11.5–14.5)
WBC: 10.3 10*3/uL (ref 3.8–10.6)

## 2014-04-16 LAB — BASIC METABOLIC PANEL
Anion Gap: 9 (ref 7–16)
BUN: 53 mg/dL — ABNORMAL HIGH (ref 7–18)
CALCIUM: 8.4 mg/dL — AB (ref 8.5–10.1)
Chloride: 103 mmol/L (ref 98–107)
Co2: 24 mmol/L (ref 21–32)
Creatinine: 2.62 mg/dL — ABNORMAL HIGH (ref 0.60–1.30)
EGFR (African American): 23 — ABNORMAL LOW
EGFR (Non-African Amer.): 20 — ABNORMAL LOW
GLUCOSE: 108 mg/dL — AB (ref 65–99)
OSMOLALITY: 287 (ref 275–301)
POTASSIUM: 3.5 mmol/L (ref 3.5–5.1)
SODIUM: 136 mmol/L (ref 136–145)

## 2014-04-16 LAB — CLOSTRIDIUM DIFFICILE(ARMC)

## 2014-04-16 LAB — TROPONIN I
TROPONIN-I: 0.06 ng/mL — AB
TROPONIN-I: 0.07 ng/mL — AB
Troponin-I: 0.07 ng/mL — ABNORMAL HIGH

## 2014-04-17 DIAGNOSIS — I359 Nonrheumatic aortic valve disorder, unspecified: Secondary | ICD-10-CM

## 2014-04-17 DIAGNOSIS — I2510 Atherosclerotic heart disease of native coronary artery without angina pectoris: Secondary | ICD-10-CM

## 2014-04-17 DIAGNOSIS — I059 Rheumatic mitral valve disease, unspecified: Secondary | ICD-10-CM

## 2014-04-17 DIAGNOSIS — R55 Syncope and collapse: Secondary | ICD-10-CM

## 2014-04-17 LAB — CBC WITH DIFFERENTIAL/PLATELET
BASOS PCT: 0.2 %
Basophil #: 0 10*3/uL (ref 0.0–0.1)
EOS PCT: 0.4 %
Eosinophil #: 0 10*3/uL (ref 0.0–0.7)
HCT: 24.4 % — AB (ref 40.0–52.0)
HGB: 7.9 g/dL — ABNORMAL LOW (ref 13.0–18.0)
LYMPHS PCT: 11.6 %
Lymphocyte #: 0.9 10*3/uL — ABNORMAL LOW (ref 1.0–3.6)
MCH: 30.9 pg (ref 26.0–34.0)
MCHC: 32.3 g/dL (ref 32.0–36.0)
MCV: 96 fL (ref 80–100)
MONO ABS: 0.8 x10 3/mm (ref 0.2–1.0)
Monocyte %: 9.8 %
Neutrophil #: 6.1 10*3/uL (ref 1.4–6.5)
Neutrophil %: 78 %
Platelet: 119 10*3/uL — ABNORMAL LOW (ref 150–440)
RBC: 2.55 10*6/uL — ABNORMAL LOW (ref 4.40–5.90)
RDW: 13.3 % (ref 11.5–14.5)
WBC: 7.8 10*3/uL (ref 3.8–10.6)

## 2014-04-17 LAB — URINALYSIS, COMPLETE
BACTERIA: NONE SEEN
BILIRUBIN, UR: NEGATIVE
Glucose,UR: NEGATIVE mg/dL (ref 0–75)
Hyaline Cast: 2
Ketone: NEGATIVE
Leukocyte Esterase: NEGATIVE
NITRITE: NEGATIVE
Ph: 5 (ref 4.5–8.0)
Protein: 30
RBC,UR: <1 /HPF (ref 0–5)
SPECIFIC GRAVITY: 1.015 (ref 1.003–1.030)
Squamous Epithelial: NONE SEEN
WBC UR: <1 /HPF (ref 0–5)

## 2014-04-17 LAB — BASIC METABOLIC PANEL
Anion Gap: 9 (ref 7–16)
BUN: 47 mg/dL — AB (ref 7–18)
CREATININE: 2.18 mg/dL — AB (ref 0.60–1.30)
Calcium, Total: 7.6 mg/dL — ABNORMAL LOW (ref 8.5–10.1)
Chloride: 110 mmol/L — ABNORMAL HIGH (ref 98–107)
Co2: 20 mmol/L — ABNORMAL LOW (ref 21–32)
EGFR (African American): 29 — ABNORMAL LOW
GFR CALC NON AF AMER: 25 — AB
Glucose: 83 mg/dL (ref 65–99)
Osmolality: 289 (ref 275–301)
Potassium: 3.8 mmol/L (ref 3.5–5.1)
SODIUM: 139 mmol/L (ref 136–145)

## 2014-04-17 LAB — PROTIME-INR
INR: 1.3
Prothrombin Time: 16 secs — ABNORMAL HIGH (ref 11.5–14.7)

## 2014-04-17 LAB — HEMOGLOBIN A1C: HEMOGLOBIN A1C: 5.4 % (ref 4.2–6.3)

## 2014-04-17 LAB — TSH: Thyroid Stimulating Horm: 1.49 u[IU]/mL

## 2014-04-18 LAB — STOOL CULTURE

## 2014-04-23 ENCOUNTER — Encounter: Payer: Self-pay | Admitting: *Deleted

## 2014-04-24 ENCOUNTER — Encounter: Payer: Self-pay | Admitting: Nurse Practitioner

## 2014-04-24 ENCOUNTER — Ambulatory Visit (INDEPENDENT_AMBULATORY_CARE_PROVIDER_SITE_OTHER): Payer: Medicare Other | Admitting: Nurse Practitioner

## 2014-04-24 VITALS — BP 140/60 | HR 72 | Ht 71.0 in | Wt 132.0 lb

## 2014-04-24 DIAGNOSIS — R55 Syncope and collapse: Secondary | ICD-10-CM

## 2014-04-24 DIAGNOSIS — I2589 Other forms of chronic ischemic heart disease: Secondary | ICD-10-CM

## 2014-04-24 DIAGNOSIS — I351 Nonrheumatic aortic (valve) insufficiency: Secondary | ICD-10-CM | POA: Insufficient documentation

## 2014-04-24 DIAGNOSIS — I10 Essential (primary) hypertension: Secondary | ICD-10-CM

## 2014-04-24 DIAGNOSIS — E785 Hyperlipidemia, unspecified: Secondary | ICD-10-CM

## 2014-04-24 DIAGNOSIS — I340 Nonrheumatic mitral (valve) insufficiency: Secondary | ICD-10-CM | POA: Insufficient documentation

## 2014-04-24 DIAGNOSIS — I2510 Atherosclerotic heart disease of native coronary artery without angina pectoris: Secondary | ICD-10-CM | POA: Insufficient documentation

## 2014-04-24 DIAGNOSIS — I255 Ischemic cardiomyopathy: Secondary | ICD-10-CM | POA: Insufficient documentation

## 2014-04-24 NOTE — Patient Instructions (Signed)
Your physician recommends that you schedule a follow-up appointment in:  2 months with Dr. Fletcher Anon   Your physician recommends that you continue on your current medications as directed. Please refer to the Current Medication list given to you today.

## 2014-04-24 NOTE — Progress Notes (Signed)
Patient Name: Jonathan Harrell Date of Encounter: 04/24/2014  Primary Care Provider:  Elsie Stain, MD Primary Cardiologist:  Jerilynn Mages. Fletcher Anon, MD   Patient Profile  80 y/o male with recent hospitalization r/t orthostasis and syncope who presents for f/u.  Problem List   Past Medical History  Diagnosis Date  . Anemia 07/14/2004  . Coronary artery disease     a. Cath in 2010->3VD->BMS to OM. CABG not felt to be option 2/2 age.  . Hyperlipemia 08/1999  . Hypertension 10/2002  . Osteoarthritis 09/2001  . History of echocardiogram 12/18/2003    Mild M. R.; MOD AI; MILD TR  . Syncope     a. Orthostatic in setting of volume depletion/diarrhea 04/2014.  Marland Kitchen History of CT scan of head 07/26/2005    Within normal limits/Carotid Ultrasound WNL 07/27/05  . Moderate mitral regurgitation   . Chronic kidney disease   . Moderate aortic regurgitation   . Ischemic cardiomyopathy     a. 04/2014 Echo: EF 35-40%, mod MR/AI.   Past Surgical History  Procedure Laterality Date  . Cholecystectomy  1980  . Insert / replace / remove pacemaker    . Cardiac catheterization  11/2008    [facility]  . Cardiac catheterization  12/2008    [facility]    Allergies  Allergies  Allergen Reactions  . Cortisone     Other reaction(s): Other (qualifier value)  . Lactose Other (See Comments)    HPI  80 y/o male with the above problem list.  He was recently admitted to [facility] in the setting of ongoing n/v and presumed food poisoning resulting in syncope.  Trop was mildly elevated during admission.  He was aggressively hydrated. Echo showed EF 35-40% in setting of known multivessel dzs s/p PCI of OM in past.  Volume status remained stable and he was d/c'd.  Since d/c, he has done well w/o c/p, dyspnea, pnd, orthopnea, n, v, dizziness, syncope, or edema. He has not been weighing himself regularly.  Home Medications  Prior to Admission medications   Medication Sig Start Date End Date Taking? Authorizing Provider  atorvastatin  (LIPITOR) 20 MG tablet Take 20 mg by mouth daily.   Yes Historical Provider, MD  carvedilol (COREG) 6.25 MG tablet Take 6.25 mg by mouth 2 (two) times daily with a meal.   Yes Historical Provider, MD  Cholecalciferol (VITAMIN D3 PO) Take by mouth daily.   Yes Historical Provider, MD  feeding supplement (ENSURE IMMUNE HEALTH) LIQD Take 237 mLs by mouth 2 (two) times daily.   Yes Historical Provider, MD  ferrous sulfate 325 (65 FE) MG tablet Take 325 mg by mouth daily with breakfast.   Yes Historical Provider, MD  ibuprofen (ADVIL,MOTRIN) 200 MG tablet OTC, take as needed as directed    Yes Historical Provider, MD  Multiple Vitamin (MULTIVITAMIN) tablet Take 1 tablet by mouth daily.     Yes Historical Provider, MD  Multiple Vitamins-Iron (MULTIVITAMIN/IRON PO) Take by mouth.     Yes Historical Provider, MD  Pediatric Multivit-Minerals-C ([employer] GUMMY DINOS) CHEW Chew by mouth daily.     Yes Historical Provider, MD  ranitidine (ACID REDUCER MAXIMUM STRENGTH) 150 MG tablet Take 150 mg by mouth daily as needed.     Yes Historical Provider, MD    Review of Systems  As above.  He denies chest pain, palpitations, dyspnea, pnd, orthopnea, n, v, dizziness, syncope, edema, weight gain, or early satiety.  All other systems reviewed and are otherwise negative except as noted  above.  Physical Exam  Blood pressure 140/60, pulse 72, height 5\' 11"  (1.803 m), weight 132 lb (59.875 kg).  General: Pleasant, NAD Psych: Normal affect. Neuro: Alert and oriented X 3. Moves all extremities spontaneously. HEENT: Normal  Neck: Supple without bruits or JVD. Lungs:  Resp regular and unlabored, CTA. Heart: RRR no s3, s4, 2/6 syst murmur throughout. Abdomen: Soft, non-tender, non-distended, BS + x 4.  Extremities: No clubbing, cyanosis or edema. DP/PT/Radials 2+ and equal bilaterally.  Assessment & Plan  1.  Syncope/Orthostasis:  In setting of significant volume depletion, n, v.  Resolved w/o recurrence.   Hemodynamically stable today.  2.  ICM/Chronic systolic CHF:  EF 60-60% by echo on last admission.  Euvolemic on exam.  Cont bb.  He was not placed on acei in setting of orthostasis and dehydration.  BP ok.  Will continue to defer on acei for fear of recurrent orthostasis.  3.  HTN:  Stable on BB.  4.  HL:  On statin.  5. CAD: known multivessel CAD but not candidate for CABG.  S/P remote PCI of OM.  Cont bb, plavix, statin.  6. Dispo:  F/u Dr. Fletcher Anon in 2 mos.    Rogelia Mire, NP 04/24/2014, 4:37 PM

## 2014-05-22 ENCOUNTER — Ambulatory Visit: Payer: Self-pay | Admitting: Internal Medicine

## 2014-06-18 ENCOUNTER — Emergency Department: Payer: Self-pay | Admitting: Emergency Medicine

## 2014-06-18 ENCOUNTER — Observation Stay: Payer: Self-pay | Admitting: Internal Medicine

## 2014-06-18 LAB — COMPREHENSIVE METABOLIC PANEL
ANION GAP: 6 — AB (ref 7–16)
AST: 46 U/L — AB (ref 15–37)
Albumin: 3.2 g/dL — ABNORMAL LOW (ref 3.4–5.0)
Albumin: 3.4 g/dL (ref 3.4–5.0)
Alkaline Phosphatase: 69 U/L
Alkaline Phosphatase: 75 U/L
Anion Gap: 7 (ref 7–16)
BUN: 32 mg/dL — AB (ref 7–18)
BUN: 35 mg/dL — AB (ref 7–18)
Bilirubin,Total: 0.7 mg/dL (ref 0.2–1.0)
Bilirubin,Total: 0.6 mg/dL (ref 0.2–1.0)
CO2: 26 mmol/L (ref 21–32)
Calcium, Total: 8.6 mg/dL (ref 8.5–10.1)
Calcium, Total: 8.6 mg/dL (ref 8.5–10.1)
Chloride: 107 mmol/L (ref 98–107)
Chloride: 104 mmol/L (ref 98–107)
Co2: 25 mmol/L (ref 21–32)
Creatinine: 1.97 mg/dL — ABNORMAL HIGH (ref 0.60–1.30)
Creatinine: 1.9 mg/dL — ABNORMAL HIGH (ref 0.60–1.30)
EGFR (African American): 33 — ABNORMAL LOW
EGFR (Non-African Amer.): 28 — ABNORMAL LOW
EGFR (Non-African Amer.): 30 — ABNORMAL LOW
GFR CALC AF AMER: 34 — AB
Glucose: 85 mg/dL (ref 65–99)
Glucose: 102 mg/dL — ABNORMAL HIGH (ref 65–99)
OSMOLALITY: 282 (ref 275–301)
Osmolality: 282 (ref 275–301)
Potassium: 4.6 mmol/L (ref 3.5–5.1)
Potassium: 4.9 mmol/L (ref 3.5–5.1)
SGOT(AST): 40 U/L — ABNORMAL HIGH (ref 15–37)
SGPT (ALT): 39 U/L
SGPT (ALT): 42 U/L
Sodium: 138 mmol/L (ref 136–145)
Sodium: 137 mmol/L (ref 136–145)
Total Protein: 7.4 g/dL (ref 6.4–8.2)
Total Protein: 7.3 g/dL (ref 6.4–8.2)

## 2014-06-18 LAB — PRO B NATRIURETIC PEPTIDE
B-TYPE NATIURETIC PEPTID: 3761 pg/mL — AB (ref 0–450)
B-Type Natriuretic Peptide: 4371 pg/mL — ABNORMAL HIGH (ref 0–450)

## 2014-06-18 LAB — CBC
HCT: 29.4 % — AB (ref 40.0–52.0)
HCT: 28.8 % — ABNORMAL LOW (ref 40.0–52.0)
HGB: 9.3 g/dL — ABNORMAL LOW (ref 13.0–18.0)
HGB: 9.4 g/dL — AB (ref 13.0–18.0)
MCH: 30.5 pg (ref 26.0–34.0)
MCH: 31.6 pg (ref 26.0–34.0)
MCHC: 31.6 g/dL — AB (ref 32.0–36.0)
MCHC: 32.5 g/dL (ref 32.0–36.0)
MCV: 97 fL (ref 80–100)
MCV: 97 fL (ref 80–100)
Platelet: 126 10*3/uL — ABNORMAL LOW (ref 150–440)
Platelet: 136 10*3/uL — ABNORMAL LOW (ref 150–440)
RBC: 3.04 10*6/uL — AB (ref 4.40–5.90)
RBC: 2.97 10*6/uL — ABNORMAL LOW (ref 4.40–5.90)
RDW: 15 % — ABNORMAL HIGH (ref 11.5–14.5)
RDW: 15 % — ABNORMAL HIGH (ref 11.5–14.5)
WBC: 8.3 10*3/uL (ref 3.8–10.6)
WBC: 7.8 10*3/uL (ref 3.8–10.6)

## 2014-06-18 LAB — TROPONIN I
Troponin-I: <0.02 ng/mL
Troponin-I: 0.02 ng/mL

## 2014-06-25 ENCOUNTER — Ambulatory Visit: Payer: Medicare Other | Admitting: Cardiovascular Disease

## 2014-11-17 ENCOUNTER — Observation Stay: Payer: Self-pay | Admitting: Internal Medicine

## 2014-11-17 DIAGNOSIS — I959 Hypotension, unspecified: Secondary | ICD-10-CM | POA: Diagnosis not present

## 2014-11-17 DIAGNOSIS — Z823 Family history of stroke: Secondary | ICD-10-CM | POA: Diagnosis not present

## 2014-11-17 DIAGNOSIS — I2510 Atherosclerotic heart disease of native coronary artery without angina pectoris: Secondary | ICD-10-CM | POA: Diagnosis not present

## 2014-11-17 DIAGNOSIS — D696 Thrombocytopenia, unspecified: Secondary | ICD-10-CM | POA: Diagnosis not present

## 2014-11-17 DIAGNOSIS — Z950 Presence of cardiac pacemaker: Secondary | ICD-10-CM | POA: Diagnosis not present

## 2014-11-17 DIAGNOSIS — D649 Anemia, unspecified: Secondary | ICD-10-CM | POA: Diagnosis not present

## 2014-11-17 DIAGNOSIS — J45998 Other asthma: Secondary | ICD-10-CM | POA: Diagnosis not present

## 2014-11-17 DIAGNOSIS — H9190 Unspecified hearing loss, unspecified ear: Secondary | ICD-10-CM | POA: Diagnosis not present

## 2014-11-17 DIAGNOSIS — E860 Dehydration: Secondary | ICD-10-CM | POA: Diagnosis not present

## 2014-11-17 DIAGNOSIS — R05 Cough: Secondary | ICD-10-CM | POA: Diagnosis not present

## 2014-11-17 DIAGNOSIS — R197 Diarrhea, unspecified: Secondary | ICD-10-CM | POA: Diagnosis not present

## 2014-11-17 DIAGNOSIS — N183 Chronic kidney disease, stage 3 (moderate): Secondary | ICD-10-CM | POA: Diagnosis not present

## 2014-11-17 DIAGNOSIS — I252 Old myocardial infarction: Secondary | ICD-10-CM | POA: Diagnosis not present

## 2014-11-17 DIAGNOSIS — Z9861 Coronary angioplasty status: Secondary | ICD-10-CM | POA: Diagnosis not present

## 2014-11-17 DIAGNOSIS — I129 Hypertensive chronic kidney disease with stage 1 through stage 4 chronic kidney disease, or unspecified chronic kidney disease: Secondary | ICD-10-CM | POA: Diagnosis not present

## 2014-11-17 DIAGNOSIS — J40 Bronchitis, not specified as acute or chronic: Secondary | ICD-10-CM | POA: Diagnosis not present

## 2014-11-17 DIAGNOSIS — N179 Acute kidney failure, unspecified: Secondary | ICD-10-CM | POA: Diagnosis not present

## 2014-11-17 DIAGNOSIS — A084 Viral intestinal infection, unspecified: Secondary | ICD-10-CM | POA: Diagnosis not present

## 2014-11-17 DIAGNOSIS — Z8249 Family history of ischemic heart disease and other diseases of the circulatory system: Secondary | ICD-10-CM | POA: Diagnosis not present

## 2014-11-17 DIAGNOSIS — I5022 Chronic systolic (congestive) heart failure: Secondary | ICD-10-CM | POA: Diagnosis not present

## 2014-11-17 LAB — INFLUENZA A,B,H1N1 - PCR (ARMC)
H1N1 flu by pcr: NOT DETECTED
INFLAPCR: NEGATIVE
INFLBPCR: NEGATIVE

## 2014-11-17 LAB — COMPREHENSIVE METABOLIC PANEL
ALK PHOS: 82 U/L
Albumin: 3.1 g/dL — ABNORMAL LOW (ref 3.4–5.0)
Anion Gap: 7 (ref 7–16)
BILIRUBIN TOTAL: 0.4 mg/dL (ref 0.2–1.0)
BUN: 32 mg/dL — ABNORMAL HIGH (ref 7–18)
CALCIUM: 8.1 mg/dL — AB (ref 8.5–10.1)
CHLORIDE: 107 mmol/L (ref 98–107)
CO2: 24 mmol/L (ref 21–32)
Creatinine: 2.09 mg/dL — ABNORMAL HIGH (ref 0.60–1.30)
GFR CALC AF AMER: 38 — AB
GFR CALC NON AF AMER: 32 — AB
Glucose: 82 mg/dL (ref 65–99)
Osmolality: 282 (ref 275–301)
Potassium: 4.6 mmol/L (ref 3.5–5.1)
SGOT(AST): 52 U/L — ABNORMAL HIGH (ref 15–37)
SGPT (ALT): 40 U/L
Sodium: 138 mmol/L (ref 136–145)
Total Protein: 6.9 g/dL (ref 6.4–8.2)

## 2014-11-17 LAB — PRO B NATRIURETIC PEPTIDE: B-Type Natriuretic Peptide: 7219 pg/mL — ABNORMAL HIGH (ref 0–450)

## 2014-11-17 LAB — CBC
HCT: 25.5 % — ABNORMAL LOW (ref 40.0–52.0)
HGB: 8.1 g/dL — ABNORMAL LOW (ref 13.0–18.0)
MCH: 31 pg (ref 26.0–34.0)
MCHC: 31.9 g/dL — ABNORMAL LOW (ref 32.0–36.0)
MCV: 97 fL (ref 80–100)
Platelet: 114 10*3/uL — ABNORMAL LOW (ref 150–440)
RBC: 2.63 10*6/uL — ABNORMAL LOW (ref 4.40–5.90)
RDW: 13.8 % (ref 11.5–14.5)
WBC: 5.9 10*3/uL (ref 3.8–10.6)

## 2014-11-17 LAB — URINALYSIS, COMPLETE
BACTERIA: NONE SEEN
BILIRUBIN, UR: NEGATIVE
Blood: NEGATIVE
GLUCOSE, UR: NEGATIVE mg/dL (ref 0–75)
KETONE: NEGATIVE
Leukocyte Esterase: NEGATIVE
Nitrite: NEGATIVE
Ph: 5 (ref 4.5–8.0)
Protein: 30
RBC,UR: NONE SEEN /HPF (ref 0–5)
SPECIFIC GRAVITY: 1.013 (ref 1.003–1.030)
SQUAMOUS EPITHELIAL: NONE SEEN

## 2014-11-17 LAB — CK-MB
CK-MB: 3.1 ng/mL (ref 0.5–3.6)
CK-MB: 2.1 ng/mL (ref 0.5–3.6)

## 2014-11-17 LAB — TROPONIN I
Troponin-I: 0.04 ng/mL
Troponin-I: 0.06 ng/mL — ABNORMAL HIGH

## 2014-11-17 LAB — CK: CK, TOTAL: 143 U/L (ref 39–308)

## 2014-11-18 DIAGNOSIS — I959 Hypotension, unspecified: Secondary | ICD-10-CM | POA: Diagnosis not present

## 2014-11-18 DIAGNOSIS — R197 Diarrhea, unspecified: Secondary | ICD-10-CM | POA: Diagnosis not present

## 2014-11-18 DIAGNOSIS — D649 Anemia, unspecified: Secondary | ICD-10-CM | POA: Diagnosis not present

## 2014-11-18 DIAGNOSIS — N183 Chronic kidney disease, stage 3 (moderate): Secondary | ICD-10-CM | POA: Diagnosis not present

## 2014-11-18 LAB — CLOSTRIDIUM DIFFICILE(ARMC)

## 2014-11-18 LAB — CK-MB: CK-MB: 1.7 ng/mL (ref 0.5–3.6)

## 2014-11-18 LAB — BASIC METABOLIC PANEL
Anion Gap: 8 (ref 7–16)
BUN: 28 mg/dL — AB (ref 7–18)
CALCIUM: 8 mg/dL — AB (ref 8.5–10.1)
CO2: 22 mmol/L (ref 21–32)
Chloride: 108 mmol/L — ABNORMAL HIGH (ref 98–107)
Creatinine: 2.08 mg/dL — ABNORMAL HIGH (ref 0.60–1.30)
GFR CALC AF AMER: 38 — AB
GFR CALC NON AF AMER: 32 — AB
Glucose: 90 mg/dL (ref 65–99)
OSMOLALITY: 281 (ref 275–301)
Potassium: 4.3 mmol/L (ref 3.5–5.1)
Sodium: 138 mmol/L (ref 136–145)

## 2014-11-18 LAB — CBC WITH DIFFERENTIAL/PLATELET
BASOS PCT: 0.2 %
Basophil #: 0 10*3/uL (ref 0.0–0.1)
EOS ABS: 0 10*3/uL (ref 0.0–0.7)
Eosinophil %: 0 %
HCT: 25.1 % — ABNORMAL LOW (ref 40.0–52.0)
HGB: 8.2 g/dL — ABNORMAL LOW (ref 13.0–18.0)
Lymphocyte #: 1.7 10*3/uL (ref 1.0–3.6)
Lymphocyte %: 17.2 %
MCH: 31.6 pg (ref 26.0–34.0)
MCHC: 32.7 g/dL (ref 32.0–36.0)
MCV: 97 fL (ref 80–100)
MONO ABS: 0.9 x10 3/mm (ref 0.2–1.0)
Monocyte %: 9.4 %
Neutrophil #: 7.1 10*3/uL — ABNORMAL HIGH (ref 1.4–6.5)
Neutrophil %: 73.2 %
PLATELETS: 102 10*3/uL — AB (ref 150–440)
RBC: 2.59 10*6/uL — ABNORMAL LOW (ref 4.40–5.90)
RDW: 13.5 % (ref 11.5–14.5)
WBC: 9.7 10*3/uL (ref 3.8–10.6)

## 2014-11-18 LAB — TROPONIN I: Troponin-I: 0.08 ng/mL — ABNORMAL HIGH

## 2014-11-19 DIAGNOSIS — N183 Chronic kidney disease, stage 3 (moderate): Secondary | ICD-10-CM | POA: Diagnosis not present

## 2014-11-19 DIAGNOSIS — R197 Diarrhea, unspecified: Secondary | ICD-10-CM | POA: Diagnosis not present

## 2014-11-19 DIAGNOSIS — A084 Viral intestinal infection, unspecified: Secondary | ICD-10-CM | POA: Diagnosis not present

## 2014-11-19 DIAGNOSIS — E860 Dehydration: Secondary | ICD-10-CM | POA: Diagnosis not present

## 2014-11-19 LAB — BASIC METABOLIC PANEL
Anion Gap: 9 (ref 7–16)
BUN: 28 mg/dL — ABNORMAL HIGH (ref 7–18)
CALCIUM: 7.9 mg/dL — AB (ref 8.5–10.1)
CHLORIDE: 108 mmol/L — AB (ref 98–107)
Co2: 22 mmol/L (ref 21–32)
Creatinine: 1.88 mg/dL — ABNORMAL HIGH (ref 0.60–1.30)
EGFR (African American): 43 — ABNORMAL LOW
GFR CALC NON AF AMER: 36 — AB
Glucose: 83 mg/dL (ref 65–99)
OSMOLALITY: 282 (ref 275–301)
Potassium: 4.2 mmol/L (ref 3.5–5.1)
SODIUM: 139 mmol/L (ref 136–145)

## 2014-11-21 DIAGNOSIS — S50902D Unspecified superficial injury of left elbow, subsequent encounter: Secondary | ICD-10-CM | POA: Diagnosis not present

## 2014-11-21 DIAGNOSIS — I129 Hypertensive chronic kidney disease with stage 1 through stage 4 chronic kidney disease, or unspecified chronic kidney disease: Secondary | ICD-10-CM | POA: Diagnosis not present

## 2014-11-21 DIAGNOSIS — Z950 Presence of cardiac pacemaker: Secondary | ICD-10-CM | POA: Diagnosis not present

## 2014-11-21 DIAGNOSIS — H9190 Unspecified hearing loss, unspecified ear: Secondary | ICD-10-CM | POA: Diagnosis not present

## 2014-11-21 DIAGNOSIS — N183 Chronic kidney disease, stage 3 (moderate): Secondary | ICD-10-CM | POA: Diagnosis not present

## 2014-11-21 DIAGNOSIS — Z4800 Encounter for change or removal of nonsurgical wound dressing: Secondary | ICD-10-CM | POA: Diagnosis not present

## 2014-11-21 DIAGNOSIS — I5022 Chronic systolic (congestive) heart failure: Secondary | ICD-10-CM | POA: Diagnosis not present

## 2014-11-21 DIAGNOSIS — I2510 Atherosclerotic heart disease of native coronary artery without angina pectoris: Secondary | ICD-10-CM | POA: Diagnosis not present

## 2014-11-21 DIAGNOSIS — I252 Old myocardial infarction: Secondary | ICD-10-CM | POA: Diagnosis not present

## 2014-11-22 LAB — CULTURE, BLOOD (SINGLE)

## 2014-11-22 LAB — STOOL CULTURE

## 2014-11-23 DIAGNOSIS — H9190 Unspecified hearing loss, unspecified ear: Secondary | ICD-10-CM | POA: Diagnosis not present

## 2014-11-23 DIAGNOSIS — I129 Hypertensive chronic kidney disease with stage 1 through stage 4 chronic kidney disease, or unspecified chronic kidney disease: Secondary | ICD-10-CM | POA: Diagnosis not present

## 2014-11-23 DIAGNOSIS — I5022 Chronic systolic (congestive) heart failure: Secondary | ICD-10-CM | POA: Diagnosis not present

## 2014-11-23 DIAGNOSIS — Z4800 Encounter for change or removal of nonsurgical wound dressing: Secondary | ICD-10-CM | POA: Diagnosis not present

## 2014-11-23 DIAGNOSIS — I252 Old myocardial infarction: Secondary | ICD-10-CM | POA: Diagnosis not present

## 2014-11-23 DIAGNOSIS — N183 Chronic kidney disease, stage 3 (moderate): Secondary | ICD-10-CM | POA: Diagnosis not present

## 2014-11-23 DIAGNOSIS — Z950 Presence of cardiac pacemaker: Secondary | ICD-10-CM | POA: Diagnosis not present

## 2014-11-23 DIAGNOSIS — I2510 Atherosclerotic heart disease of native coronary artery without angina pectoris: Secondary | ICD-10-CM | POA: Diagnosis not present

## 2014-11-23 DIAGNOSIS — S50902D Unspecified superficial injury of left elbow, subsequent encounter: Secondary | ICD-10-CM | POA: Diagnosis not present

## 2014-11-26 DIAGNOSIS — I429 Cardiomyopathy, unspecified: Secondary | ICD-10-CM | POA: Diagnosis not present

## 2014-11-26 DIAGNOSIS — I340 Nonrheumatic mitral (valve) insufficiency: Secondary | ICD-10-CM | POA: Diagnosis not present

## 2014-11-26 DIAGNOSIS — A041 Enterotoxigenic Escherichia coli infection: Secondary | ICD-10-CM | POA: Diagnosis not present

## 2014-11-27 DIAGNOSIS — H9190 Unspecified hearing loss, unspecified ear: Secondary | ICD-10-CM | POA: Diagnosis not present

## 2014-11-27 DIAGNOSIS — Z950 Presence of cardiac pacemaker: Secondary | ICD-10-CM | POA: Diagnosis not present

## 2014-11-27 DIAGNOSIS — N183 Chronic kidney disease, stage 3 (moderate): Secondary | ICD-10-CM | POA: Diagnosis not present

## 2014-11-27 DIAGNOSIS — I5022 Chronic systolic (congestive) heart failure: Secondary | ICD-10-CM | POA: Diagnosis not present

## 2014-11-27 DIAGNOSIS — I129 Hypertensive chronic kidney disease with stage 1 through stage 4 chronic kidney disease, or unspecified chronic kidney disease: Secondary | ICD-10-CM | POA: Diagnosis not present

## 2014-11-27 DIAGNOSIS — I2510 Atherosclerotic heart disease of native coronary artery without angina pectoris: Secondary | ICD-10-CM | POA: Diagnosis not present

## 2014-11-27 DIAGNOSIS — I252 Old myocardial infarction: Secondary | ICD-10-CM | POA: Diagnosis not present

## 2014-11-27 DIAGNOSIS — S50902D Unspecified superficial injury of left elbow, subsequent encounter: Secondary | ICD-10-CM | POA: Diagnosis not present

## 2014-11-27 DIAGNOSIS — Z4800 Encounter for change or removal of nonsurgical wound dressing: Secondary | ICD-10-CM | POA: Diagnosis not present

## 2014-11-29 DIAGNOSIS — S51001A Unspecified open wound of right elbow, initial encounter: Secondary | ICD-10-CM | POA: Diagnosis not present

## 2014-11-29 DIAGNOSIS — N183 Chronic kidney disease, stage 3 (moderate): Secondary | ICD-10-CM | POA: Diagnosis not present

## 2014-11-29 DIAGNOSIS — Z4800 Encounter for change or removal of nonsurgical wound dressing: Secondary | ICD-10-CM | POA: Diagnosis not present

## 2014-11-29 DIAGNOSIS — Z950 Presence of cardiac pacemaker: Secondary | ICD-10-CM | POA: Diagnosis not present

## 2014-11-29 DIAGNOSIS — I5022 Chronic systolic (congestive) heart failure: Secondary | ICD-10-CM | POA: Diagnosis not present

## 2014-11-29 DIAGNOSIS — I2510 Atherosclerotic heart disease of native coronary artery without angina pectoris: Secondary | ICD-10-CM | POA: Diagnosis not present

## 2014-11-29 DIAGNOSIS — S50902D Unspecified superficial injury of left elbow, subsequent encounter: Secondary | ICD-10-CM | POA: Diagnosis not present

## 2014-11-29 DIAGNOSIS — I252 Old myocardial infarction: Secondary | ICD-10-CM | POA: Diagnosis not present

## 2014-11-29 DIAGNOSIS — H9190 Unspecified hearing loss, unspecified ear: Secondary | ICD-10-CM | POA: Diagnosis not present

## 2014-11-29 DIAGNOSIS — I129 Hypertensive chronic kidney disease with stage 1 through stage 4 chronic kidney disease, or unspecified chronic kidney disease: Secondary | ICD-10-CM | POA: Diagnosis not present

## 2014-12-04 DIAGNOSIS — S50902D Unspecified superficial injury of left elbow, subsequent encounter: Secondary | ICD-10-CM | POA: Diagnosis not present

## 2014-12-04 DIAGNOSIS — Z950 Presence of cardiac pacemaker: Secondary | ICD-10-CM | POA: Diagnosis not present

## 2014-12-04 DIAGNOSIS — I129 Hypertensive chronic kidney disease with stage 1 through stage 4 chronic kidney disease, or unspecified chronic kidney disease: Secondary | ICD-10-CM | POA: Diagnosis not present

## 2014-12-04 DIAGNOSIS — I5022 Chronic systolic (congestive) heart failure: Secondary | ICD-10-CM | POA: Diagnosis not present

## 2014-12-04 DIAGNOSIS — N183 Chronic kidney disease, stage 3 (moderate): Secondary | ICD-10-CM | POA: Diagnosis not present

## 2014-12-04 DIAGNOSIS — Z4800 Encounter for change or removal of nonsurgical wound dressing: Secondary | ICD-10-CM | POA: Diagnosis not present

## 2014-12-04 DIAGNOSIS — H9190 Unspecified hearing loss, unspecified ear: Secondary | ICD-10-CM | POA: Diagnosis not present

## 2014-12-04 DIAGNOSIS — I252 Old myocardial infarction: Secondary | ICD-10-CM | POA: Diagnosis not present

## 2014-12-04 DIAGNOSIS — I2510 Atherosclerotic heart disease of native coronary artery without angina pectoris: Secondary | ICD-10-CM | POA: Diagnosis not present

## 2014-12-06 DIAGNOSIS — I129 Hypertensive chronic kidney disease with stage 1 through stage 4 chronic kidney disease, or unspecified chronic kidney disease: Secondary | ICD-10-CM | POA: Diagnosis not present

## 2014-12-06 DIAGNOSIS — Z4800 Encounter for change or removal of nonsurgical wound dressing: Secondary | ICD-10-CM | POA: Diagnosis not present

## 2014-12-06 DIAGNOSIS — I252 Old myocardial infarction: Secondary | ICD-10-CM | POA: Diagnosis not present

## 2014-12-06 DIAGNOSIS — S50902D Unspecified superficial injury of left elbow, subsequent encounter: Secondary | ICD-10-CM | POA: Diagnosis not present

## 2014-12-06 DIAGNOSIS — Z950 Presence of cardiac pacemaker: Secondary | ICD-10-CM | POA: Diagnosis not present

## 2014-12-06 DIAGNOSIS — I2510 Atherosclerotic heart disease of native coronary artery without angina pectoris: Secondary | ICD-10-CM | POA: Diagnosis not present

## 2014-12-06 DIAGNOSIS — N183 Chronic kidney disease, stage 3 (moderate): Secondary | ICD-10-CM | POA: Diagnosis not present

## 2014-12-06 DIAGNOSIS — I5022 Chronic systolic (congestive) heart failure: Secondary | ICD-10-CM | POA: Diagnosis not present

## 2014-12-06 DIAGNOSIS — H9190 Unspecified hearing loss, unspecified ear: Secondary | ICD-10-CM | POA: Diagnosis not present

## 2014-12-11 DIAGNOSIS — Z4800 Encounter for change or removal of nonsurgical wound dressing: Secondary | ICD-10-CM | POA: Diagnosis not present

## 2014-12-11 DIAGNOSIS — N183 Chronic kidney disease, stage 3 (moderate): Secondary | ICD-10-CM | POA: Diagnosis not present

## 2014-12-11 DIAGNOSIS — S50902D Unspecified superficial injury of left elbow, subsequent encounter: Secondary | ICD-10-CM | POA: Diagnosis not present

## 2014-12-11 DIAGNOSIS — I252 Old myocardial infarction: Secondary | ICD-10-CM | POA: Diagnosis not present

## 2014-12-11 DIAGNOSIS — H9190 Unspecified hearing loss, unspecified ear: Secondary | ICD-10-CM | POA: Diagnosis not present

## 2014-12-11 DIAGNOSIS — I2510 Atherosclerotic heart disease of native coronary artery without angina pectoris: Secondary | ICD-10-CM | POA: Diagnosis not present

## 2014-12-11 DIAGNOSIS — I129 Hypertensive chronic kidney disease with stage 1 through stage 4 chronic kidney disease, or unspecified chronic kidney disease: Secondary | ICD-10-CM | POA: Diagnosis not present

## 2014-12-11 DIAGNOSIS — Z950 Presence of cardiac pacemaker: Secondary | ICD-10-CM | POA: Diagnosis not present

## 2014-12-11 DIAGNOSIS — I5022 Chronic systolic (congestive) heart failure: Secondary | ICD-10-CM | POA: Diagnosis not present

## 2014-12-17 DIAGNOSIS — N183 Chronic kidney disease, stage 3 (moderate): Secondary | ICD-10-CM | POA: Diagnosis not present

## 2014-12-17 DIAGNOSIS — I252 Old myocardial infarction: Secondary | ICD-10-CM | POA: Diagnosis not present

## 2014-12-17 DIAGNOSIS — Z4800 Encounter for change or removal of nonsurgical wound dressing: Secondary | ICD-10-CM | POA: Diagnosis not present

## 2014-12-17 DIAGNOSIS — I129 Hypertensive chronic kidney disease with stage 1 through stage 4 chronic kidney disease, or unspecified chronic kidney disease: Secondary | ICD-10-CM | POA: Diagnosis not present

## 2014-12-17 DIAGNOSIS — Z950 Presence of cardiac pacemaker: Secondary | ICD-10-CM | POA: Diagnosis not present

## 2014-12-17 DIAGNOSIS — I2510 Atherosclerotic heart disease of native coronary artery without angina pectoris: Secondary | ICD-10-CM | POA: Diagnosis not present

## 2014-12-17 DIAGNOSIS — S50902D Unspecified superficial injury of left elbow, subsequent encounter: Secondary | ICD-10-CM | POA: Diagnosis not present

## 2014-12-17 DIAGNOSIS — H9190 Unspecified hearing loss, unspecified ear: Secondary | ICD-10-CM | POA: Diagnosis not present

## 2014-12-17 DIAGNOSIS — I5022 Chronic systolic (congestive) heart failure: Secondary | ICD-10-CM | POA: Diagnosis not present

## 2014-12-21 DIAGNOSIS — I252 Old myocardial infarction: Secondary | ICD-10-CM | POA: Diagnosis not present

## 2014-12-21 DIAGNOSIS — F410 Panic disorder [episodic paroxysmal anxiety] without agoraphobia: Secondary | ICD-10-CM | POA: Diagnosis not present

## 2014-12-21 DIAGNOSIS — S50902D Unspecified superficial injury of left elbow, subsequent encounter: Secondary | ICD-10-CM | POA: Diagnosis not present

## 2014-12-21 DIAGNOSIS — H9190 Unspecified hearing loss, unspecified ear: Secondary | ICD-10-CM | POA: Diagnosis not present

## 2014-12-21 DIAGNOSIS — D509 Iron deficiency anemia, unspecified: Secondary | ICD-10-CM | POA: Diagnosis not present

## 2014-12-21 DIAGNOSIS — N183 Chronic kidney disease, stage 3 (moderate): Secondary | ICD-10-CM | POA: Diagnosis not present

## 2014-12-21 DIAGNOSIS — I2510 Atherosclerotic heart disease of native coronary artery without angina pectoris: Secondary | ICD-10-CM | POA: Diagnosis not present

## 2014-12-21 DIAGNOSIS — Z950 Presence of cardiac pacemaker: Secondary | ICD-10-CM | POA: Diagnosis not present

## 2014-12-21 DIAGNOSIS — I498 Other specified cardiac arrhythmias: Secondary | ICD-10-CM | POA: Diagnosis not present

## 2014-12-21 DIAGNOSIS — Z4800 Encounter for change or removal of nonsurgical wound dressing: Secondary | ICD-10-CM | POA: Diagnosis not present

## 2014-12-21 DIAGNOSIS — I129 Hypertensive chronic kidney disease with stage 1 through stage 4 chronic kidney disease, or unspecified chronic kidney disease: Secondary | ICD-10-CM | POA: Diagnosis not present

## 2014-12-21 DIAGNOSIS — I5022 Chronic systolic (congestive) heart failure: Secondary | ICD-10-CM | POA: Diagnosis not present

## 2014-12-26 DIAGNOSIS — I5022 Chronic systolic (congestive) heart failure: Secondary | ICD-10-CM | POA: Diagnosis not present

## 2014-12-26 DIAGNOSIS — I129 Hypertensive chronic kidney disease with stage 1 through stage 4 chronic kidney disease, or unspecified chronic kidney disease: Secondary | ICD-10-CM | POA: Diagnosis not present

## 2014-12-26 DIAGNOSIS — I252 Old myocardial infarction: Secondary | ICD-10-CM | POA: Diagnosis not present

## 2014-12-26 DIAGNOSIS — Z4800 Encounter for change or removal of nonsurgical wound dressing: Secondary | ICD-10-CM | POA: Diagnosis not present

## 2014-12-26 DIAGNOSIS — N183 Chronic kidney disease, stage 3 (moderate): Secondary | ICD-10-CM | POA: Diagnosis not present

## 2014-12-26 DIAGNOSIS — H9190 Unspecified hearing loss, unspecified ear: Secondary | ICD-10-CM | POA: Diagnosis not present

## 2014-12-26 DIAGNOSIS — Z950 Presence of cardiac pacemaker: Secondary | ICD-10-CM | POA: Diagnosis not present

## 2014-12-26 DIAGNOSIS — S50902D Unspecified superficial injury of left elbow, subsequent encounter: Secondary | ICD-10-CM | POA: Diagnosis not present

## 2014-12-26 DIAGNOSIS — I2510 Atherosclerotic heart disease of native coronary artery without angina pectoris: Secondary | ICD-10-CM | POA: Diagnosis not present

## 2015-03-08 NOTE — H&P (Signed)
PATIENT NAME:  Jonathan Harrell, Jonathan Harrell MR#:  [id number] DATE OF BIRTH:  [birth year]  PRIMARY CARE PHYSICIAN: Dr. Lavera Guise.   CHIEF COMPLAINT: Diarrhea, weakness, one episode of bright red blood per rectum.   HISTORY OF PRESENT ILLNESS: A 81 year old male patient with history of coronary artery disease, pacemaker, prior GI bleed in the past who presents to the Emergency Room sent in by  primary care physician with complaints of weakness and diarrhea for 2 days. The patient had multiple bouts, almost 10 to 15, of diarrhea for 2 days. Today he has had only 2 episodes of watery brown diarrhea. He has been feeling extremely weak and his primary care physician asked him to come to the Emergency Room. Here, stool occult was done but did not show any hemorrhoids. No bright blood on rectal exam. The patient had black stools which were stool positive. The patient is on iron pills.   He does not complain of any lightheadedness but does have worsening fatigue over a month as per the son, although the patient denies this. He still lives independently, walks on his own, and drives. His creatinine is 2.1 with baseline creatinine of 2.1. Hemoglobin is 9.9 and had been low since 2013. Two weeks back with his primary care physician, this was 9.5.   He did notice 1 episode of blood with his stool yesterday. Nothing before or after this. The patient had colonoscopy done in 2006 for an episode of anemia and blood per rectum. Had severe diverticulosis then. He is presently on Plavix.   PAST MEDICAL HISTORY:  1.  Coronary artery disease, status post angioplasty in 1991.  2.  Cholecystectomy.  3.  Permanent pacemaker for syncope.  4.  CKD, stage III, with baseline creatinine 2.1 5.  Chronic anemia.  6.  Diverticulosis.   ALLERGIES: CORTISONE.   SOCIAL HISTORY: Does not smoke. No alcohol. No illicit drugs. Lives independently.   FAMILY HISTORY: Hypertension.   REVIEW OF SYSTEMS:  CONSTITUTIONAL: No fatigue. Complains of some  weakness.  EYES: No blurred vision, pain or redness.  ENT: No tinnitus, ear pain. Has some hearing loss.  RESPIRATORY: No cough, wheezing, or hemoptysis.  CARDIOVASCULAR: No chest pain, orthopnea, edema.  GASTROINTESTINAL: Has had some nausea and diarrhea. No abdominal pain.  GENITOURINARY: No dysuria, hematuria, frequency.  ENDOCRINE: No polyuria, nocturia, thyroid problems.  HEMATOLOGIC: Has anemia. No easy bruising, bleeding.  INTEGUMENTARY: No acne, rash, lesions.  MUSCULOSKELETAL: Arthritis.  NEUROLOGIC: No focal numbness, seizures.  PSYCHIATRIC: No anxiety or depression.   HOME MEDICATIONS:  Include:  1.  Ferrous sulfate 325 mg oral 3 times a day.  2.  Multivitamin 1 tablet oral daily.  3.  Pindolol 5 mg oral once a day.  4.  Vitamin D3 one tablet oral once a day.  5.  Plavix 75 mg oral once a day.   PHYSICAL EXAMINATION:  VITAL SIGNS: Temperature 97.7, pulse 77, blood pressure 130/59, saturating 95% on room air. GENERAL: Elderly Caucasian male patient lying in bed, comfortable, conversational, cooperative with exam.  PSYCHIATRIC: Alert and oriented x3. Mood and affect appropriate. Judgement intact.  HEENT: Atraumatic, normocephalic. Oral mucosa moist and pink. External ears and nose normal. Pallor positive. No icterus.  NECK: Supple. No thyromegaly. No palpable lymph nodes. Trachea midline. No carotid bruit, JVD.  CARDIOVASCULAR: S1, S2, systolic murmur. Peripheral pulses 2+. No edema.  RESPIRATORY: Normal work of breathing. Clear to auscultation on both sides.  GASTROINTESTINAL: Soft abdomen, nontender. Bowel sounds present. No organomegaly palpable.  SKIN: Warm  and dry. No petechiae, rash, ulcers.  MUSCULOSKELETAL: No joint swelling, redness, effusion of the large joints. Normal muscle tone.  NEUROLOGICAL: Motor strength 5/5 in upper and lower extremities. Sensation to fine touch intact all over.  LYMPHATIC: No cervical, supraclavicular lymphadenopathy.   LABORATORIES:  Today, glucose 93, BUN 32, creatinine 2.10, potassium 5.2. CK of 254, troponin less than 0.02 with MB 6.1.   WBC 7.5, hemoglobin 9.9, MCV 92.   Urinalysis shows no bacteria.   EKG shows right bundle branch block, paced rhythm.   ASSESSMENT AND PLAN:  1.  Diarrhea with weakness. The patient has had diarrhea in the past with Clostridium difficile. Will check a Clostridium difficile and stool WBC. His diarrhea seems to be improving slowly. Will not start him on any antibiotics at this time. He did have 1 episode of bleeding and does have stool positive for Hemoccult. I suspect this is secondary to the diarrhea/diverticulosis. We will wait for the labs, consult gastroenterology for the same. Would will also hold his iron pills at this time which could be causing his diarrhea. I do not think he needs colonoscopy at this time but may need it if he has any further bleeding or diarrhea does not resolve. Will hold the Plavix. The patient's baseline hemoglobin seems to be around 9 and it is 9.9 and stable.  2.  Chronic kidney disease, stage III, stable.  3.  Dehydration. Start on IV fluids.  4.  Hyperkalemia, likely from dehydration and the chronic kidney disease. Will repeat in the morning.  5.  Elevated CPK-MB. This has been a chronic issue for the patient.  6.  Coronary artery disease, stable.  7.  Deep vein thrombosis prophylaxis with sequential compression devices.   CODE STATUS: FULL CODE.   TIME SPENT TODAY ON THIS CASE: Was 68 minutes.    ____________________________ Leia Alf Shonta Phillis, MD srs:np D: 08/02/2013 16:35:00 ET T: 08/02/2013 17:13:13 ET JOB#: [id number]  cc: Alveta Heimlich R. Alf Doyle, MD, <Dictator> Cletis Athens, MD Neita Carp MD ELECTRONICALLY SIGNED 08/02/2013 21:43

## 2015-03-08 NOTE — Consult Note (Signed)
Pt seen and examined. Full consult to follow. Acute diarrhea with 1 episode of bleeding. Hx of severe diverticulosis. On plavix, which was put on hold. Slight drop in hgb. Stool studies ordered. No recent Abx. Prob has infectious diarrhea. 2 BM's yest AM. 3 last night. 1 so far this AM. Consider starting flagyl while we wait for stool results. Hopefully, diarhea will resolve over time. Will follow. Thanks.  Electronic Signatures: Verdie Shire (MD)  (Signed on 18-Sep-14 08:00)  Authored  Last Updated: 18-Sep-14 08:00 by Verdie Shire (MD)

## 2015-03-08 NOTE — Consult Note (Signed)
Diaarrhea stopped. Abd nontender. No abd pain.Possible discharge by tomorrow. Finish course of Abx. Will sign off. Thanks  Electronic Signatures: Verdie Shire (MD)  (Signed on 19-Sep-14 16:06)  Authored  Last Updated: 19-Sep-14 16:06 by Verdie Shire (MD)

## 2015-03-08 NOTE — Discharge Summary (Signed)
PATIENT NAME:  Jonathan Harrell, Jonathan Harrell MR#:  [id number] DATE OF BIRTH:  1918/07/08  DATE OF ADMISSION:  08/02/2013 DATE OF DISCHARGE:  08/05/2013  PRIMARY CARE PHYSICIAN: Cletis Athens, MD  GASTROENTEROLOGIST: Lupita Dawn. Candace Cruise, MD  FINAL DIAGNOSES: 1.  Diarrhea with rectal bleeding.  2.  Dehydration.  3.  Chronic kidney disease.  4.  Acute anemia on chronic anemia.  5.  History of coronary artery disease.   MEDICATIONS ON DISCHARGE: Include pindolol 5 mg at bedtime, multivitamin 1 Gummy Bear daily, ferrous sulfate 325 mg 3 times a day, vitamin D 1 tablet daily, metronidazole 250 mg every 8 hours for 7 more days, Ensure 240 mL twice a day.   DIET: Low-sodium diet. Ensure twice a day. Regular consistency on the diet.   ACTIVITY: As tolerated.   FOLLOWUP: With Dr. Candace Cruise of gastroenterology 2 weeks, Dr. Lavera Guise 1 to 2 weeks.   HOSPITAL COURSE: The patient was admitted as an observation on 08/02/2013. Admitted with diarrhea, weakness. The patient was given IV fluids.   Laboratory and radiological data during the hospital course included EKG that showed paced, right bundle branch block, left anterior fascicular block. Chest x-ray: No evidence of pneumonia. INR 1.1, PTT elevated at 39.3. White blood cell count 7.5, hemoglobin and hematocrit 9.9 and 29.8, platelet count 168. Glucose 93, BUN 32, creatinine 2.10, sodium 136, potassium 5.2, chloride 106, CO2 of 25, calcium 8.6. Liver function tests: Normal range. Urinalysis: 25 mg/dL of protein. Upon discharge, hemoglobin 8.4.   HOSPITAL COURSE PER PROBLEM LIST:  1.  For the patient's diarrhea with rectal bleeding, the patient had no further diarrhea. Unable to send off stool studies. No further rectal bleeding. I think the hemoglobin did drop secondary to bleeding and dilutional with IV fluids. No need for transfusion on the hospital stay. The patient will go back on his iron. The patient was treated empirically for possible C. diff. Because of the patient's age,  could be ischemic colitis. Follow up with gastroenterology as outpatient.  2.  Dehydration: The patient was given IV fluid hydration.  3.  The patient does have chronic kidney disease. His last creatinine was 2.1, making him chronic kidney disease stage IV  4.  Acute anemia on chronic anemia: The patient will go back on his ferrous sulfate. Can consider Procrit as outpatient.  5.  History of coronary artery disease: The patient will be off his Plavix at this point in time. Continue pindolol.  TIME SPENT ON DISCHARGE: 35 minutes.   ____________________________ Tana Conch. Leslye Peer, MD rjw:jm D: 08/05/2013 15:47:20 ET T: 08/05/2013 17:06:57 ET JOB#: [id number]  cc: Tana Conch. Leslye Peer, MD, <Dictator> Cletis Athens, MD Lupita Dawn. Candace Cruise, MD  Marisue Brooklyn MD ELECTRONICALLY SIGNED 08/10/2013 14:42

## 2015-03-09 NOTE — H&P (Signed)
PATIENT NAME:  Jonathan Harrell, Jonathan Harrell MR#:  [id number] DATE OF BIRTH:  1918-09-25  DATE OF ADMISSION:  04/16/2014  PRIMARY PHYSICIAN:  Cletis Athens, MD  REQUESTING PHYSICIAN: Conni Slipper, MD  CHIEF COMPLAINT:  Syncope.    HISTORY OF PRESENT ILLNESS: The patient is a 81 year old male with a known history of coronary artery disease and myocardial infarction, status post pacemaker, is being admitted for syncope and collapse. The patient lives at [facility].  He was found to have syncope and collapse this morning around 5:00 a.m. He got up to change his diapers around 2:00 a.m. and while at the entrance of his dressing room, he completely blacked out, does not remember much. He sat on the floor until 5:00 a.m. when his son, who lives across the street, came to help him out.  He does report that he hit his front of the head and his right elbow, which was hurting. His son was able to transfer him to the rolling chair and eventually to bed and EMS was called to bring him to the Emergency Department. The patient reports having loose stools since last 3 to 4 days with poor appetite. Denies any abdominal pain. He reports his diarrhea is from eating some old meat although that was on the second day after he started having diarrhea but since then, the diarrhea has gotten worse as per the patient.  There is no blood in the stool.   PAST MEDICAL HISTORY: 1.  History of coronary artery disease, status post stent.  2.  Myocardial infarction in 1991.  3.  Pacemaker placement.   ALLERGIES: CORTISONE AND LACTOSE.   SOCIAL HISTORY: No smoking, no alcohol. 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   FAMILY HISTORY: Mother with history of cerebrovascular accident. Father with pancreatic cancer.   MEDICATIONS AT HOME:  Per the medication reconciliation, although this has not been updated are as follows:   1.  Ferrous sulfate 325 mg 3 times a day.  2.  Multivitamin once  daily.  3.  Pindolol 5 mg p.o. at bedtime.  4.  Vitamin D3 once daily.   REVIEW OF SYSTEMS:    CONSTITUTIONAL: No fever. Positive fatigue and weakness.  EYES: No blurred or double vision.  ENT: Decreased hearing, has been wearing hearing aids which helps. No tinnitus or ear pain.  RESPIRATORY: No cough, wheezing, hemoptysis.  CARDIOVASCULAR: No chest pain, orthopnea, edema.  GASTROINTESTINAL: Positive for diarrhea.  No abdominal pain or vomiting.  GENITOURINARY: No dysuria or hematuria.  ENDOCRINE: No polyuria or nocturia.  HEMATOLOGY: No anemia or easy bruising.  SKIN: He does have minimal abrasion on his forehead and also on the right elbow.  NEUROLOGIC: Positive for syncope and passing out with collapse. No tingling or numbness.   PSYCHIATRIC: No history of anxiety or depression.   PHYSICAL EXAMINATION: VITAL SIGNS: Temperature 98, heart rate 75 per minute, respirations 18 per minute, blood pressure 112/51 mmHg, he was saturating 94% on room air.  GENERAL: The patient is a 81 year old male lying in the bed comfortably without any acute distress.  EYES: Pupils equal, round, equal to light and accommodation. No scleral icterus. Extraocular muscles intact.  HENT: Head atraumatic, normocephalic. Oropharynx and nasopharynx clear.  NECK: Supple. No tenderness or distension.  No thyroid enlargement.   LUNGS: Clear to auscultation bilaterally. No wheezing, rales, rhonchi or crepitation.  CARDIOVASCULAR: S1, S2 normal. No murmurs, rubs or gallops.  ABDOMEN: Soft, nontender, nondistended. Bowel  sounds present. No organomegaly or masses.  EXTREMITIES: No pedal edema, cyanosis or clubbing. He has a bandage applied on his right elbow. No obvious bony tenderness around the elbow.  SKIN:  Minimal abrasion on the right elbow and on the forehead, likely from the fall.  NEUROLOGIC: Cranial nerves II through XII intact. Muscle strength 5 out of 5 in all extremities. Sensation intact.  PSYCHIATRIC: The  patient is alert and oriented x 3  MUSCULOSKELETAL: No joint effusion or tenderness.   LABORATORY, DIAGNOSTIC AND RADIOLOGICAL DATA:   1.  Normal CBC except hemoglobin of 9.2, hematocrit 28.0, platelets 126. BMP showed BUN of 53, creatinine 2.62. Troponin of 0.07.  2.  EKG showed no acute STT changes.  3.  CT scan of the head in the ED showed moderate atrophy with no acute findings.  4.  Chest x-ray showed bilateral areas of scarring. No acute changes.   IMPRESSION AND PLAN: 1.  Syncope and collapse. Will check orthostatic vitals, carotid Dopplers and TSH. Will get physical therapy evaluation. Get serial troponins. Monitor him on telemetry and obtain 2-D echo. Cannot get MRI due to pacemaker. We will get neurology and cardiology consultation for further evaluation.  2.  Diarrhea. Will obtain stool studies. This is likely viral, possibly from eating the old meat.  3.  Elevated troponin, likely due to supply/demand ischemia although cannot rule out myocardial infarction at this time. Will get serial troponins and obtain 2-D echocardiogram. Get cardiology consultation, start him on aspirin at this time.  4.  Acute on chronic kidney disease stage III. His last creatinine was around 2.1, now it is 2.6. This is likely due to dehydration/prerenal stage. We will hydrate him with IV fluids and monitor his kidney function. Avoid any nephrotoxins.  5.  History of coronary artery disease.  We will get cardiology consultation, obtain 2-D echocardiogram, monitor him on telemetry and get serial troponins. Start him on aspirin.  6.  CODE STATUS: Full code.   TOTAL TIME TAKING CARE OF THIS PATIENT: 55 minutes.   ____________________________ Lucina Mellow. Manuella Ghazi, MD vss:cs D: 04/16/2014 14:37:00 ET T: 04/16/2014 15:01:14 ET JOB#: [id number]  cc: Fabiola Mudgett S. Manuella Ghazi, MD, <Dictator> Cletis Athens, MD [place] MD ELECTRONICALLY SIGNED 04/19/2014 9:55

## 2015-03-09 NOTE — Discharge Summary (Signed)
PATIENT NAME:  Jonathan Harrell, Jonathan Harrell MR#:  [id number] DATE OF BIRTH:  Mar 28, 1918  DATE OF ADMISSION:  04/16/2014 DATE OF DISCHARGE:  04/18/2014  PRESENTING COMPLAINT: Syncope.   DISCHARGE DIAGNOSES:  Syncope due to orthostasis improved, resolved.  Chronic kidney disease stage III.  CODE STATUS: FULL CODE.   MEDICATIONS:  1.  Pindolol 5 mg at bedtime.  2.  Multivitamin daily.  3.  Ferrous sulfate 325 p.o. t.i.d.  4.  Vitamin D3, 1 tablet once a day.  5.  Ensure 240 mL b.i.d.  6.  Carvedilol 6.25 b.i.d.  7.  Lipitor 20 mg at bedtime.   Home health, physical therapy has been arranged.   CONSULTATIONS:  Dr. Rockey Situ, cardiology and Dr. Irish Elders, neurology.   UA negative for UTI. Creatinine is 2.18. Sodium is 139. Potassium is 3.8. A1c is 5.4. TSH is 1.49. Stool cultures and C. diff negative. Hemoglobin  and HCT 9.2 and 28.0.   BRIEF SUMMARY OF HOSPITAL COURSE: [name] is a 81 year old Caucasian gentleman with history of coronary artery disease, hypertension, impaired hearing, comes in with:  1.  Syncope and collapse, suspected from orthostasis, dehydration in the setting of diarrhea. The patient received IV fluids, feels better. Seen by physical therapy, recommends home health PT and use of rollator walker, which was prescribed. He remained in sinus rhythm.  2.  Diarrhea, possibly viral, from eating old meat and recent trip to the [place]. White count is normal. Stool studies were negative. No abdominal pain.  3.  Acute on chronic kidney disease stage III. His last creatinine was around 2.1. Received IV fluids. The patient's creatinine is stable at discharge of 2.18.  4.  History of coronary artery disease, seen by cardiology. No further testing needed per cardiology.   The patient was discharged to home with home health PT.   TIME SPENT: 50 minutes.    ____________________________ Hart Rochester Posey Pronto, MD sap:dmm D: 04/18/2014 15:32:59 ET T: 04/18/2014 19:09:27  ET JOB#: [id number]  cc: Gorje Iyer A. Posey Pronto, MD, <Dictator> Minna Merritts, MD Leotis Pain, MD Ilda Basset MD ELECTRONICALLY SIGNED 04/30/2014 14:16

## 2015-03-09 NOTE — Discharge Summary (Signed)
PATIENT NAME:  Jonathan Harrell, Jonathan Harrell MR#:  [id number] DATE OF BIRTH:  [birth year]  DATE OF ADMISSION:  06/18/2014 DATE OF DISCHARGE:  06/19/2014  DISCHARGE DIAGNOSES:  1. Right lower lobe pneumonia.  2. Pulmonary fibrosis.  3. Hypertension.  4. Iron deficiency anemia.   CONSULTATIONS: None.   DISCHARGE MEDICATIONS:  1. Vitamin D3 1 tablet oral once a day.  2. Lipitor 20 mg daily.  3. Coreg 6.25 mg 2 tablets once a day.  4. Ferrous sulfate 325 mg oral once a day.  5. Multivitamin 1 tablet daily.  6. Levaquin 250 mg oral daily.  7. DuoNeb 3 mL inhaler every 4 hours as needed for shortness of breath.  DISCHARGE INSTRUCTIONS: Low-sodium diet. Activity as tolerated. Follow up with Dr. Lavera Guise in 1 to 2 weeks.   IMAGING STUDIES DONE: Includes a CT scan of the chest without contrast showed right lower lobe pneumonia along with pulmonary fibrosis.   ADMITTING HISTORY AND PHYSICAL: Please see detailed H and P dictated previously by Dr. Lavetta Nielsen. In brief, a 81 year old Caucasian gentleman with history of CAD, hypertension, who presented to the hospital complaining of shortness of breath. The patient was previously seen in the Emergency Room, started on antibiotics, and was sent home. The patient had some worsening shortness of breath which is episodic, returned to the Emergency Room, was admitted to the hospitalist service.    HOSPITAL COURSE:  1. Community-acquired pneumonia with pulmonary fibrosis, chronic obstructive pulmonary disease. The patient has had on-and-off, shortness of breath with wheezing, cough but did not have any inhalers, nebulizers, presented to the ER. The patient was started on IV antibiotics. He has improved well, afebrile, normal white count and his shortness of breath has improved with nebulizer therapy and will be discharged home in a stable condition. The patient will follow up with his primary care physician in 1-2 weeks. The patient has had a problem swallowing large pills,  could not his Levaquin at home. I switched the Levaquin to a liquid form, which he is tolerating well.  2. Hypertension.  3. Heart failure.  4. Anemia, been stable.   Prior to discharge, the patient has no wheezing, has some left lower lobe crackles. Heart sounds are normal. No edema.   TIME SPENT ON DAY OF DISCHARGE IN DISCHARGE ACTIVITY: 40 minutes.    ____________________________ Leia Alf Daria Mcmeekin, MD srs:lt D: 06/21/2014 14:23:16 ET T: 06/21/2014 19:57:15 ET JOB#: [id number]  cc: Alveta Heimlich R. Adin Lariccia, MD, <Dictator> Cletis Athens, MD Neita Carp MD ELECTRONICALLY SIGNED 07/02/2014 14:10

## 2015-03-09 NOTE — Consult Note (Signed)
PATIENT NAME:  Jonathan Harrell, Jonathan Harrell MR#:  [id number] DATE OF BIRTH:  Dec 12, 1917  DATE OF CONSULTATION:  04/16/2014  REFERRING PHYSICIAN:  Dr. Manuella Ghazi. CONSULTING PHYSICIAN:  Muhammad A. Fletcher Anon, MD  PRIMARY CARE PHYSICIAN:  Dr. Cletis Athens.  REASON FOR CONSULTATION:  Syncope.   HISTORY OF PRESENT ILLNESS:  This is a 81 year old male with known history of coronary artery disease, cardiomyopathy and status post pacemaker placement. Cardiac catheterization in 2010 showed significant three-vessel coronary artery disease. Due to his age, he underwent one-vessel PCI and not CABG at that time. Ejection fraction was 45%. He presented after he had a syncopal episode this morning. He has been having diarrhea for 5 days nonstop. He had gradual weakness and when he tried to sit up, he had a brief loss of consciousness and was on the floor. He was recently on a cruise to the [place]. He also had an old meal that was likely spoiled. He denies any chest pain or shortness of breath.   PAST MEDICAL HISTORY: 1.  Three-vessel coronary artery disease, status post OM PCI with bare metal stent placement in 2010.  2. Cardiomyopathy with previous ejection fraction of 45%.  3.  Status post pacemaker placement, followed by Dr. Lavera Guise.  4.  Hypertension.  5.  Chronic kidney disease. 6.  Anemia of chronic disease.  ALLERGIES:  Include CORTISONE AND LACTOSE.   SOCIAL HISTORY: Negative for smoking, alcohol or recreational drug use. He is a retired English as a second language teacher.   FAMILY HISTORY:  Remarkable for a stroke.   HOME MEDICATIONS:   Include ferrous sulfate, multivitamin, blood pressure medication and vitamin D3.    REVIEW OF SYSTEMS: A 10 point review of systems was performed. It is negative other than what is mentioned in the HPI.   PHYSICAL EXAMINATION: GENERAL: The patient appears to be at his stated age, in no acute distress.  VITAL SIGNS: Temperature is 97.4. Pulse is 71. Respiratory rate is 20. Blood pressure is 130/63, which  dropped to 113/71 with standing.  HEENT:  Normocephalic, atraumatic.  NECK:  No JVD or carotid bruits.  RESPIRATORY:  Normal respiratory effort with no use of accessory muscles. Auscultation reveals normal breath sounds.  CARDIOVASCULAR:  Normal PMI. Normal S1 and S2 with no gallops. There is a 2/6 systolic ejection murmur at the left sternal border and also 2/6 decrescendo murmur at the left sternal border.  ABDOMEN: Benign, nontender and nondistended.  EXTREMITIES: No clubbing, cyanosis or edema.   LABORATORY, DIAGNOSTIC, AND RADIOLOGICAL DATA: ECG showed electronic ACL pacemaker with right bundle branch block and left anterior fascicular block. Labs showed creatinine of 2.62 with previous creatinine of 2.10 in September of last year. BUN is 53. Troponin is 0.07. Hemoglobin was 9.2 with a platelet count of 126.   IMPRESSION: 1.  Syncope, likely due to orthostatic hypotension.  2.  Acute on chronic renal failure, likely due to volume depletion.  3.  Diarrhea with recent traveling to the [place].  4. Coronary artery disease with mildly elevated cardiac enzymes, likely supply/demand ischemia.  5.  Status post pacemaker placement.   RECOMMENDATIONS:  The patient's presentation is likely due to orthostatic hypotension from volume depletion resulting from 5 days of diarrhea. I agree with IV hydration. He was orthostatic by vital signs. Hold blood pressure medications for now. Echocardiogram showed an ejection fraction of 35% to 40% with moderate mitral regurgitation and moderate aortic regurgitation. There is no evidence of a cardiac event. The mildly elevated cardiac enzymes is likely due to supply/demand  ischemia. The patient has no anginal symptoms. There is no indication of a pacemaker malfunction. He reports having the pacemaker checked recently before his travel with Dr. Lavera Guise. I recommend evaluation for his diarrhea given his recent travel. Consider GI consultation.    ____________________________ Mertie Clause Fletcher Anon, MD maa:dmm D: 04/16/2014 18:27:07 ET T: 04/16/2014 20:09:02 ET JOB#: [id number]  cc: Rogue Jury A. Fletcher Anon, MD, <Dictator> Wellington Hampshire MD ELECTRONICALLY SIGNED 04/24/2014 17:11

## 2015-03-09 NOTE — Consult Note (Signed)
Brief Consult Note: Diagnosis: syncope likely due to orthostatic hypotension and volume depletion.   Patient was seen by consultant.   Consult note dictated.   Comments: Agree with IVFs.  Recommend evaluation for diarrhea.  Electronic Signatures: Kathlyn Sacramento (MD)  (Signed 01-Jun-15 18:19)  Authored: Brief Consult Note   Last Updated: 01-Jun-15 18:19 by Kathlyn Sacramento (MD)

## 2015-03-09 NOTE — Consult Note (Signed)
PATIENT NAME:  Jonathan Harrell, Jonathan Harrell MR#:  [id number] DATE OF BIRTH:  [birth year]  DATE OF CONSULTATION:  04/16/2014  CONSULTING PHYSICIAN:  Leotis Pain, MD  HISTORY OF PRESENT ILLNESS: This is a 81 year old male with a past medical history of coronary artery disease, status post myocardial infarction with a pacemaker. Lives in a home facility presented with a syncopal episode. The patient also reports having loose diarrhea for about 5 days and has poor p.o. intake. Currently appears back to baseline. No seizure activity.   PAST MEDICAL HISTORY: History of coronary artery disease, status post a pacemaker, myocardial infarction, and cardiac stents.   ALLERGIES: INCLUDE CORTISOL AND LACTOSE.   SOCIAL HISTORY: No smoking. No EtOH use. No history of drug use.   FAMILY HISTORY: Noncontributory.  REVIEW OF SYSTEMS: No fever, positive for generalized fatigue, decreased hearing. No wheezing. No cough. No shortness of breath. No chest pain. No abdominal pain. Positive for diarrhea. No constipation. Positive for syncope and collapse.   LABORATORY DATA: Workup has been reviewed. The patient is status post carotid Dopplers which did not show any signs of hemodynamic stenosis. CAT scan of the head: Moderate atrophy. No acute intracranial pathology.   PHYSICAL EXAMINATION:  VITAL SIGNS: Include blood pressure 124/62, pulse 73, positive orthostasis.  NEUROLOGIC: The patient alert and awake, oriented to name and facility. Extraocular movements intact. Pupils reactive bilaterally. Facial sensation intact. Speech intact. Motor strength 5/5 bilateral in upper and lower extremities. Reflexes are diminished. Coordination intact. Finger-to-nose intact. Gait not assessed.   IMPRESSION: A 81 year old male status post syncopal episode found to have diarrhea for about 5 days likely dehydration likely due to orthostasis.   PLAN: Hydration, supportive care. No need for further imaging from neurological standpoint. I  suspect this is all due to orthostasis and hypotension.   Thank you. It was a pleasure seeing this patient.    ____________________________ Leotis Pain, MD yz:lt D: 04/16/2014 21:48:31 ET T: 04/16/2014 22:16:08 ET JOB#: [id number]  cc: Leotis Pain, MD, <Dictator> Leotis Pain MD ELECTRONICALLY SIGNED 05/03/2014 17:20

## 2015-03-09 NOTE — H&P (Signed)
PATIENT NAME:  Jonathan Harrell, Jonathan Harrell MR#:  [id number] DATE OF BIRTH:  April 25, 1918  DATE OF ADMISSION:  06/18/2014  REFERRING PHYSICIAN: Dr. Jimmye Norman  PRIMARY CARE PHYSICIAN: Dr. Lavera Guise   CARDIOLOGIST: Dr. Fletcher Anon   CHIEF COMPLAINT: Short of breath.   HISTORY OF PRESENT ILLNESS: A 81 year old Caucasian gentleman with history of coronary artery disease status post PCI and stent placement, hypertension, presenting with shortness of breath. Describes shortness of breath as two to three day duration, mainly as dyspnea on exertion without associated cough, fevers, chills, chest pain, palpitations, orthopnea, edema, or PND. Came to the Emergency Department for gradually worsening dyspnea on exertion, diagnosed with possible pneumonia based on chest x-ray and CAT scan findings, was actually discharged home; however, while at home his symptoms worsened and he re-presented to the hospital. He, of note, does describe having some episodes of choking after and during eating and drinking, which has been long-standing. However, has no specialized diet.    REVIEW OF SYSTEMS:  CONSTITUTIONAL: Denies fever, fatigue, weakness.  EYES: Denies blurred vision, double vision, eye pain.  EARS, NOSE, THROAT: Denies tinnitus, ear pain, hearing loss.  RESPIRATORY: Denies cough, wheeze. Positive for shortness of breath as described above.  CARDIOVASCULAR: No chest pain, palpitations, or edema.  GASTROINTESTINAL: Denies nausea, vomiting, diarrhea, abdominal pain.  GENITOURINARY: Denies dysuria, hematuria.  ENDOCRINE: Denies nocturia or thyroid problems. HEMATOLOGIC AND LYMPHATIC: Denies easy bruising, bleeding.  SKIN: Denies rash or lesions.  MUSCULOSKELETAL: Denies pain in neck, back, shoulder, knees, hips or arthritic symptoms.  NEUROLOGIC: Denies paralysis, paresthesias.  PSYCHIATRIC: Denies anxiety or depressive symptoms.  Otherwise, full review of systems performed by me is negative.   PAST MEDICAL HISTORY: Coronary  artery disease status post PCI and stent placement, dysphagia, as well as hypertension.   SOCIAL HISTORY: Denies alcohol, tobacco, or drug usage. He is retired from being a English as a second language teacher. However, denies having actual fume or dust exposure.   FAMILY HISTORY: Positive for coronary artery disease.   ALLERGIES: CORTISONE, ACETATE, LACTOSE.   HOME MEDICATIONS: Lipitor 20 mg p.o. daily, Coreg 6.25 mg 2 tabs p.o. daily, Feosol 325 mg p.o. daily, multivitamin 1 tab p.o. daily, vitamin D3 1 tab p.o. daily.   PHYSICAL EXAMINATION: VITAL SIGNS: Temperature 97.7, heart rate 74, respirations 20, blood pressure 116/55, saturating 95% on room air. Weight 53.4 kg, BMI 16.4.  GENERAL: Weak, frail-appearing Caucasian gentleman, currently in no acute distress.  HEAD: Normocephalic, atraumatic.  EYES: Pupils are equal, round, and reactive to light. Extraocular movements intact. No scleral icterus.  MOUTH: Dry mucosal membrane. Dentition intact. No abscess noted.  EARS, NOSE, AND THROAT: Clear, without exudates. No external lesions.  NECK: Supple. No thyromegaly. No nodules. No JVD.  PULMONARY: Diminished breath sounds throughout all lung fields secondary to effort. However, no frank wheezes, rales, or rhonchi. Chest nontender to palpation. CARDIOVASCULAR: S1, S2. Regular rate and rhythm. No murmurs, rubs, or gallops. No edema. Pedal pulses 2+ bilaterally.  GASTROINTESTINAL: Soft, nontender, nondistended. No masses. Positive bowel sounds. No hepatosplenomegaly.  MUSCULOSKELETAL: No swelling, clubbing, or edema. Range of motion full in all extremities.  NEUROLOGIC: Cranial nerves II through XII intact. No gross focal neurological deficits. Sensation intact. Reflexes intact.  SKIN: No ulceration, lesions, rashes, or cyanosis. Skin warm, dry. Turgor intact.  PSYCHIATRIC: Mood and affect within normal limits. The patient is awake, alert and oriented x3. Insight and judgment intact.   LABORATORY DATA: Chest x-ray  performed, revealing progression of pulmonary interstitial prominence, bilaterally and diffusely, consistent with interstitial  process overlying his initial lung disease. Cannot exclude pneumonitis. CT chest then performed, revealing peripheral interstitial thickening of the lungs bilaterally, more pronounced in the upper lobes, compatible with early fibrosis as well as rounded airspace opacity, posterior right lower lobe, may reflect atelectasis versus pneumonia. Remainder of laboratory data: Sodium 138, potassium 4.6, chloride 107, bicarbonate 25, BUN 32, creatinine 1.97, glucose 85. LFTs within normal limits. WBC 7.8, hemoglobin 9.4, platelets of 136.   ASSESSMENT AND PLAN: A 81 year old gentleman with history of coronary artery disease, presenting with shortness of breath.  1.  Pneumonitis. Suspect potential aspiration pneumonitis. No fever or white count. No indication for continued antibiotic coverage; however, he did receive Levaquin in the Emergency Room. We will continue this as potential pneumonia, as stated on x-ray, though unlikely to actually be pneumonia. Provide DuoNeb treatments q.4 hours, supplemental oxygen to keep oxygen saturation greater than 92%. Will discuss the potential benefits of steroids in pneumonitis; however, the patient refuses steroids, stating allergy to cortisone causing paranoia. 2.  Hypertension. Continue with Coreg.  3.  Hyperlipidemia. Continue statin therapy.  4.  Deep venous thrombosis prophylaxis. Heparin subcutaneous.   CODE STATUS: The patient is full code.   TIME SPENT: 45 minutes.    ____________________________ Jonathan Mose. Hower, MD dkh:cg D: 06/18/2014 22:26:53 ET T: 06/19/2014 00:11:50 ET JOB#: [id number]  cc: Jonathan Mose. Hower, MD, <Dictator> DAVID Woodfin Ganja MD ELECTRONICALLY SIGNED 06/20/2014 0:17

## 2015-03-13 NOTE — H&P (Signed)
PATIENT NAME:  Jonathan Harrell, Jonathan Harrell MR#:  [id number] DATE OF BIRTH:  August 08, 1918  DATE OF ADMISSION:  11/17/2014  REFERRING EMERGENCY ROOM PHYSICIAN: Ferman Hamming, MD.   PRIMARY CARE PHYSICIAN: Cletis Athens, MD.   CHIEF COMPLAINT: Cough, weakness and diarrhea.   HISTORY OF PRESENT ILLNESS: This very pleasant, 81 year old man with past medical history of coronary artery disease, status post PCI, hypertension, chronic systolic heart failure with an ejection fraction of 35% to 40%, pacemaker in place, presents today with complaint of cough, weakness and diarrhea. He reports that he has been coughing for 6 days with postnasal drip and sinus-type congestion. He has had some shortness of breath, mainly when choking on postnasal drip and mucus. He has been able to cough up thick, yellow-green mucus. No wheezing. No chest pain. Today, he has developed diarrhea and he has had 3 explosive watery bowel movements in the past 8 hours. On presentation, his blood pressure is 98/50. His creatinine has gone up from 1.9 to 2.09. He is admitted for further evaluation and treatment of diarrhea, hypertension, acute renal failure.   PAST MEDICAL HISTORY: 1. Coronary artery disease, status post PCI.  2. Chronic systolic heart failure with ejection fraction of 35% to 40% by 2-D echocardiogram in June of 2015.  3. Pacemaker in place.  4. Hypertension.  5. History of dysphasia.  6. History of myocardial infarction in 1991.   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 He lives alone, but his son lives across the street. He uses a cane and/or a walker. He does not smoke cigarettes, drink alcohol or use illicit substances.   FAMILY MEDICAL HISTORY: Positive for coronary artery disease in his father, cerebrovascular accident in his mother and pancreatic in his father.   ALLERGIES: CORTISONE AND LACTULOSE.   HOME MEDICATIONS: 1. Atorvastatin 20 mg 1 tablet daily at  bedtime.  2. Carvedilol 6.2 mg 2 tablets once a day at bedtime.  3. Ferrous sulfate 3.25 mg 1 tablet daily.  4. Multivitamin 1 tablet daily.  5. Vitamin D3 one tablet daily.   REVIEW OF SYSTEMS: CONSTITUTIONAL: No fevers, chills, fatigue or weakness. No pain or change in weight.   HEENT: No change in vision or hearing. No pain in eyes or ears. Does have some difficulty swallowing, choking mainly on postnasal drip. Does have sinus congestion and pain. No sore throat or difficulty swallowing.   RESPIRATORY: Positive for cough with thick, yellow-green sputum. No wheezing, no hemoptysis, no painful respirations, no history of COPD.   CARDIOVASCULAR: No chest pain, palpitations, edema or orthopnea. No syncope.   GASTROINTESTINAL: No nausea or vomiting. Positive for diarrhea with mild crampy-type abdominal pain. No hematochezia or melena.   GENITOURINARY: No dysuria or frequency.   MUSCULOSKELETAL: No warm, swollen or tender joints. No new pain in the neck, back, shoulders, knees or hips. Does have osteoarthritis in multiple sites. No history of gout.   NEUROLOGIC: No focal numbness, weakness. No dysarthria. No epilepsy. No history of CVA or dementia.   PSYCHIATRIC: No history of bipolar disorder or schizophrenia. No uncontrolled anxiety or depression.   PHYSICAL EXAMINATION: VITAL SIGNS: Temperature 98.4, pulse 70, respirations 18, blood pressure 153/62, oxygenation 100% on room air.   GENERAL: No acute distress.   HEENT: Pupils equal, round and reactive to light. Extraocular motion intact. Conjunctivae clear. Oral mucous membranes are pink and moist. Good dentition. Posterior oropharynx is clear of exudate, edema or erythema. No postnasal drip  noted.   NECK: Supple. No cervical lymphadenopathy.   RESPIRATORY: There are bibasilar crackles. Good air movement. No wheezes or rhonchi. No respiratory distress or coughing during examination.   CARDIOVASCULAR: Regular rate and rhythm. No  murmurs, rubs or gallops. Peripheral pulses are 2+. There is no edema.   ABDOMEN: Soft, nontender, nondistended. There is some tenderness to palpation in bilateral lower quadrants. No guarding, no rebound, no hepatosplenomegaly, no mass noted.   MUSCULOSKELETAL: No joint effusions. Strength is 5/5 throughout.   SKIN: No rash noted. No wounds or lesions.   NEUROLOGIC: Cranial nerves II through XII grossly intact. Strength and sensation intact. Tone is appropriate.   PSYCHIATRIC: The patient is alert and oriented to person, place and time. He understands his clinical situation. He shows no signs of uncontrolled depression or anxiety.   LABORATORY DATA: Sodium 138, potassium 4.6, chloride 107, bicarbonate 24. BUN 32, creatinine 2.09. BNP is 7219 albumin is 3.1. AST 52. White blood cells 5.9, hemoglobin 8.1, platelets 114,000, MCV is 97.   IMAGING: Chest x-ray shows chronic pulmonary fibrotic changes without acute or superimposed abnormality.   ASSESSMENT AND PLAN: 1. Acute on chronic kidney disease, stage 3; last creatinine in August of 2015 was 1.9. Today, creatinine is 2.09. This is a mild change. Will hydrate gently, given his history of systolic failure. We will check a urinalysis, which is pending.  2. Cough: Symptoms sound as if he actually has a sinus infection with postnasal drip causing cough and choking. Chest x-ray is clear. We will start azithromycin for sinus infection.  3. Diarrhea: We will check stool cultures and Clostridium difficile. He is not currently having diarrhea. We will continue to monitor. Volume loss is likely the reason for his renal failure.  4. Presenting hypotension at 98/50. This has resolved. Blood pressure is now 153/63 with hydration.  5. History of coronary artery disease: He is not having any chest pain at this time. Continue with aspirin and statin, beta blocker. He does have a history of systolic failure and his BNP is significantly elevated higher than prior  values. Also presented with hypotension. I will get a set of cardiac enzymes. His EKG at this time does not show any signs of ischemic change. Will monitor on telemetry.  6. Chronic systolic heart failure with ejection fraction of 35% to 40%. No signs of acute failure at this time. We will monitor carefully as he is being hydrated for renal injury. We will check daily weights and keep on a low-sodium diet.  7. Acute on chronic anemia: His stool guaiac is negative. Hemoglobin has gone from 9.4 to 8.1 since August. We will check iron studies. He has not noted any overt bleeding. The only anticoagulant he is on is aspirin.  8. Prophylaxis: We will hold off on pharmacological deep vein thrombosis prophylaxis as the patient is fairly ambulatory and he does have both anemia and thrombocytopenia at this time. No gastrointestinal prophylaxis as the patient is not currently acutely ill.   TIME SPENT ON ADMISSION: 40 minutes.    ____________________________ Earleen Newport. Volanda Napoleon, MD cpw:TT D: 11/17/2014 14:05:52 ET T: 11/17/2014 14:32:40 ET JOB#: [id number]  cc: Barnetta Chapel P. Volanda Napoleon, MD, <Dictator> Aldean Jewett MD ELECTRONICALLY SIGNED 11/18/2014 20:11

## 2015-03-17 NOTE — Discharge Summary (Signed)
PATIENT NAME:  Jonathan Harrell, Jonathan Harrell MR#:  [id number] DATE OF BIRTH:  Aug 29, 1918  DATE OF ADMISSION:  11/17/2014 DATE OF DISCHARGE:  11/19/2014  DISCHARGE DIAGNOSES: 1. Acute renal failure over chronic kidney disease stage III.  2. Dehydration.  3. Viral gastroenteritis with diarrhea.  4. Acute sinusitis and bronchitis.  5. Coronary artery disease.  6. Chronic systolic congestive heart failure.  7. Hearing loss.  8. Weakness.  9. Anemia.  10. Hypertension.   DISCHARGE MEDICATIONS: 1. Ferrous sulfate 325 mg oral once a day.  2. Multivitamin 1 tablet daily.  3. Coreg 6.25 mg 2 tablets once a day at bedtime.  4. Atorvastatin 20 mg daily.  5. Vitamin D3 1000 mg daily.  6. Azithromycin 250 mg oral once a day.  7. Chlorpheniramine/dextromethorphan 10 mL 3 times a day as needed for cough.   DISCHARGE INSTRUCTIONS: Regular diet. Activity as tolerated. Follow up with primary care physician in 1-2 weeks. Home health with PT and nursing has been set up.   IMAGING STUDIES: Include a chest x-ray, PA and lateral, which showed no infection, edema, pneumothorax.   CONSULTATIONS: None.   ADMITTING HISTORY AND PHYSICAL: Please see detailed H and P dictated by Dr. Volanda Napoleon.   BRIEF SUMMARY AND HOSPITAL COURSE:  81.  A 81 year old Caucasian male patient with history of hypertension, chronic systolic CHF, CAD, presented to the hospital complaining of cough, weakness, diarrhea. The patient was found to have mild hypotension along with diarrhea and dehydration, admitted to hospitalist service.  2.  Acute renal failure over CKD stage III due to diarrhea, dehydration. The patient was fluid resuscitated with IV fluids, with which his creatinine has returned to his baseline. The patient's diarrhea has resolved. This was secondary to viral gastroenteritis. He stool cultures were negative. Clostridium difficile was checked, which was normal. The patient was afebrile. Normal WBC. No abdominal pain. No fever.  3.   Acute sinusitis, bronchitis. The patient has had significant cough. Chest x-ray is clear. No wheezing found. The patient is being started on azithromycin, along with some cough medication.  4.  Chronic systolic congestive heart failure, hypertension, anemia have been stable during the hospital stay.   Prior to discharge, the patient has worked with physical therapy. Home health has been recommended, which has been set up. Lungs sound clear. S1, S2 heard with systolic murmur. No edema. Abdomen soft. Bowel sounds present.   TIME SPENT ON DAY OF DISCHARGE IN DISCHARGE ACTIVITY: 40 minutes.    ____________________________ Leia Alf Belkys Henault, MD srs:mw D: 11/20/2014 13:17:44 ET T: 11/20/2014 14:24:58 ET JOB#: [id number]  cc: Alveta Heimlich R. Glover Capano, MD, <Dictator> Cletis Athens, MD Neita Carp MD ELECTRONICALLY SIGNED 11/30/2014 12:49

## 2015-03-19 DIAGNOSIS — I429 Cardiomyopathy, unspecified: Secondary | ICD-10-CM | POA: Diagnosis not present

## 2015-03-19 DIAGNOSIS — R05 Cough: Secondary | ICD-10-CM | POA: Diagnosis not present

## 2015-03-19 DIAGNOSIS — J219 Acute bronchiolitis, unspecified: Secondary | ICD-10-CM | POA: Diagnosis not present

## 2015-03-19 DIAGNOSIS — D509 Iron deficiency anemia, unspecified: Secondary | ICD-10-CM | POA: Diagnosis not present

## 2015-03-21 DIAGNOSIS — Z950 Presence of cardiac pacemaker: Secondary | ICD-10-CM | POA: Diagnosis not present

## 2015-03-21 DIAGNOSIS — I429 Cardiomyopathy, unspecified: Secondary | ICD-10-CM | POA: Diagnosis not present

## 2015-03-21 DIAGNOSIS — R42 Dizziness and giddiness: Secondary | ICD-10-CM | POA: Diagnosis not present

## 2015-03-26 ENCOUNTER — Emergency Department: Payer: Medicare Other

## 2015-03-26 ENCOUNTER — Emergency Department
Admission: EM | Admit: 2015-03-26 | Discharge: 2015-03-27 | Disposition: A | Discharge status: Home / Self Care | Payer: Medicare Other | Attending: Emergency Medicine | Admitting: Emergency Medicine

## 2015-03-26 DIAGNOSIS — R531 Weakness: Secondary | ICD-10-CM | POA: Diagnosis not present

## 2015-03-26 DIAGNOSIS — I10 Essential (primary) hypertension: Secondary | ICD-10-CM | POA: Diagnosis not present

## 2015-03-26 DIAGNOSIS — Z79899 Other long term (current) drug therapy: Secondary | ICD-10-CM | POA: Diagnosis not present

## 2015-03-26 DIAGNOSIS — J439 Emphysema, unspecified: Secondary | ICD-10-CM | POA: Diagnosis not present

## 2015-03-26 DIAGNOSIS — R404 Transient alteration of awareness: Secondary | ICD-10-CM | POA: Diagnosis not present

## 2015-03-26 DIAGNOSIS — R1310 Dysphagia, unspecified: Secondary | ICD-10-CM | POA: Diagnosis not present

## 2015-03-26 DIAGNOSIS — E860 Dehydration: Secondary | ICD-10-CM | POA: Diagnosis not present

## 2015-03-26 MED ORDER — SODIUM CHLORIDE 0.9 % IV BOLUS (SEPSIS)
1000.0000 mL | Freq: Once | INTRAVENOUS | Status: AC
Start: 2015-03-26 — End: 2015-03-27
  Administered 2015-03-27: 1000 mL via INTRAVENOUS

## 2015-03-26 NOTE — ED Provider Notes (Signed)
[facility] Emergency Department Provider Note  ____________________________________________  Time seen: Approximately 10:39 PM  I have reviewed the triage vital signs and the nursing notes.   HISTORY  Chief Complaint Dysphagia    HPI Jonathan Harrell is a 81 y.o. male who presents today after having an episode of weakness and not eating as well as he usually does over the last couple of weeks. His son who is also with him states that his father has been having frequent episodes of having to spit up and has "dysphagia" which she is had over about 10 years but this has slowly worsened to the point that he is having a very difficult time keeping well hydrated. He is only able to hold down soups and shakes.  Today he had an episode of feeling very weak and tired with walking. This has resolved now and his father is acting normally and appropriate.  Of note patient has been progressively losing weight, which is distributed to his poor eating.  He denies any numbness, weakness, headache, chest pain, abdominal pain, vomiting or other symptoms. He did have a "chest cold" one week ago and was treated with Levaquin which has improved, but he does still have a slight cough.  Insidious onset. Mild to moderate severity. No recent surgeries.     Past Medical History  Diagnosis Date  . Anemia 07/14/2004  . Coronary artery disease     a. Cath in 2010->3VD->BMS to OM. CABG not felt to be option 2/2 age.  . Hyperlipemia 08/1999  . Hypertension 10/2002  . Osteoarthritis 09/2001  . History of echocardiogram 12/18/2003    Mild M. R.; MOD AI; MILD TR  . Syncope     a. Orthostatic in setting of volume depletion/diarrhea 04/2014.  Marland Kitchen History of CT scan of head 07/26/2005    Within normal limits/Carotid Ultrasound WNL 07/27/05  . Moderate mitral regurgitation   . Chronic kidney disease   . Moderate aortic regurgitation   . Ischemic cardiomyopathy     a. 04/2014 Echo: EF  35-40%, mod MR/AI.    Patient Active Problem List   Diagnosis Date Noted  . Ischemic cardiomyopathy   . Syncope   . Moderate mitral regurgitation   . Moderate aortic regurgitation   . Coronary artery disease   . VITAMIN D DEFICIENCY 03/20/2010  . DIARRHEA 03/06/2010  . NEVI, MULTIPLE 10/24/2008  . HYPERLIPIDEMIA 10/24/2008  . ANEMIA-NOS 10/24/2008  . HYPERTENSION 10/24/2008  . CORONARY ARTERY DISEASE 10/24/2008  . RBBB 10/24/2008  . MICROSCOPIC HEMATURIA 10/24/2008  . OSTEOARTHRITIS 10/24/2008  . SYNCOPE, HX OF 10/24/2008  . Hypertension 10/16/2002  . Hyperlipemia 08/17/1999    Past Surgical History  Procedure Laterality Date  . Cholecystectomy  1980  . Insert / replace / remove pacemaker    . Cardiac catheterization  11/2008    [facility]  . Cardiac catheterization  12/2008    [facility]    Current Outpatient Rx  Name  Route  Sig  Dispense  Refill  . atorvastatin (LIPITOR) 20 MG tablet   Oral   Take 20 mg by mouth daily.         . carvedilol (COREG) 6.25 MG tablet   Oral   Take 6.25 mg by mouth 2 (two) times daily with a meal.         . Cholecalciferol (VITAMIN D3 PO)   Oral   Take by mouth daily.         . feeding supplement (ENSURE IMMUNE  HEALTH) LIQD   Oral   Take 237 mLs by mouth 2 (two) times daily.         . ferrous sulfate 325 (65 FE) MG tablet   Oral   Take 325 mg by mouth daily with breakfast.         . ibuprofen (ADVIL,MOTRIN) 200 MG tablet      OTC, take as needed as directed          . Multiple Vitamin (MULTIVITAMIN) tablet   Oral   Take 1 tablet by mouth daily.           . Multiple Vitamins-Iron (MULTIVITAMIN/IRON PO)   Oral   Take by mouth.           . Pediatric Multivit-Minerals-C ([employer] GUMMY DINOS) CHEW   Oral   Chew by mouth daily.           . ranitidine (ACID REDUCER MAXIMUM STRENGTH) 150 MG tablet   Oral   Take 150 mg by mouth daily as needed.             Allergies Cortisone and Lactose  Family History   Problem Relation Age of Onset  . Hypertension Mother   . Stroke Mother   . Cancer Father     prostate    Social History History  Substance Use Topics  . Smoking status: Never Smoker   . Smokeless tobacco: Not on file  . Alcohol Use: No    Review of Systems Constitutional: No fever/chills Eyes: No visual changes. ENT: No sore throat. Chronic trouble with swallowing. Patient notes he must sit up and lean his neck forward to swallow. He has a spittoon at home which she uses frequently as he sometimes has trouble swallowing his spit. This is not acute, this has been an ongoing problem. He has been told he needs to have thickened diet, but previously has refused and he and his son agreed that he will continue to whatever food he desires. Cardiovascular: Denies chest pain. Respiratory: Denies shortness of breath and has had a chest cold a little over a week Gastrointestinal: No abdominal pain.  No nausea, no vomiting.  No diarrhea.  No constipation. Genitourinary: Negative for dysuria. Musculoskeletal: Negative for back pain. Skin: Negative for rash. Neurological: Negative for headaches, focal weakness or numbness.  10-point ROS otherwise negative.  ____________________________________________   PHYSICAL EXAM:  VITAL SIGNS: ED Triage Vitals  Enc Vitals Group     BP 03/26/15 2211 151/75 mmHg     Pulse Rate 03/26/15 2211 71     Resp 03/26/15 2211 20     Temp 03/26/15 2211 97.5 F (36.4 C)     Temp src --      SpO2 03/26/15 2211 91 %     Weight 03/26/15 2211 111 lb (50.349 kg)     Height 03/26/15 2211 5\' 10"  (1.778 m)     Head Cir --      Peak Flow --      Pain Score --      Pain Loc --      Pain Edu? --      Excl. in [place]? --     Constitutional: Alert and oriented. Frail-appearing, cachectic but in no acute distress. Eyes: Conjunctivae are normal. PERRL. EOMI. Head: Atraumatic. Nose: No congestion/rhinnorhea. Mouth/Throat: Mucous membranes are slightly dry.   Oropharynx non-erythematous. Patient is able to exhibit good swallowing when sitting up and leaning his neck forward. He is able to swallow his secretions.  Neck: No stridor.   Cardiovascular: Normal rate, regular rhythm. Grossly normal heart sounds.  Good peripheral circulation. Respiratory: Normal respiratory effort.  No retractions. Lungs CTAB. Gastrointestinal: Soft and nontender. No distention. No abdominal bruits. No CVA tenderness. Musculoskeletal: No lower extremity tenderness nor edema.  No joint effusions. Neurologic:  Normal speech and language. No gross focal neurologic deficits are appreciated. Speech is normal. No gait instability. Skin:  Skin is warm, dry and intact. No rash noted. Psychiatric: Mood and affect are normal. Speech and behavior are normal. Patient is notably very well oriented.  ____________________________________________   LABS (all labs ordered are listed, but only abnormal results are displayed)  Labs Reviewed  CBC  BASIC METABOLIC PANEL  URINALYSIS COMPLETEWITH MICROSCOPIC ([place])    ____________________________________________  EKG  Pending at time of sign out ____________________________________________  RADIOLOGY  Chest x-rayIMPRESSION: Severe emphysematous and fibrotic changes without evidence of an acute superimposed process. ____________________________________________   PROCEDURES  Procedure(s) performed: None  Critical Care performed: No  ____________________________________________   INITIAL IMPRESSION / ASSESSMENT AND PLAN / ED COURSE  Pertinent labs & imaging results that were available during my care of the patient were reviewed by me and considered in my medical decision making (see chart for details).  Patient presents with an episode of weakness today. He has been feeling fatigued. This is in the setting of long-standing dysphagia and trouble taking in calories. Patient and his son have tried to get him to take good  caloric intake, but this is a very much a challenge with his dysphagia. The patient would not be interested in any sort of feeding tube. He does use high protein calorie shakes, but is only able to keep down about 1 a day otherwise he is limited generally to soups.  The patient is very frail appearing, but in no distress. I believe his symptoms today are likely due to some mild to moderate dehydration based on his very poor by mouth intake which is limited by his difficult to treat dysphagia. He is totally neurologically intact. He has no cardiac symptoms. He has had a cough which is improving after treatment with Levaquin. We will obtain a chest x-ray to evaluate for signs of aspiration, pneumonia, or congestive heart failure though I suspect his symptoms again are from dehydration.  Given the patient's age of 67, discussed with patient and family, we are not going to pursue aggressive treatment for his dysphagia rather he will continue with his diet as he always has. We will however generously hydrate in the ER with a liter of fluid, check electrolytes, EKG, and chest x-ray. ____________________________________________  ----------------------------------------- 11:17 PM on 03/26/2015 -----------------------------------------  Care and disposition assigned to Dr. Dahlia Client. Follow-up on ECG, CBC, basic metabolic panel, UA, and response to fluid bolus. Likely plan is to have patient discharged (should there not be any acute abnormalities noted on laboratory evaluation ) after hydration and follow-up closely with Dr. Rebecka Apley his PCP this week pending laboratory evaluation and reevaluation post-fluid bolus.  FINAL CLINICAL IMPRESSION(S) / ED DIAGNOSES  Final diagnoses:  None   mild dehydration, initial, acute Dysphagia, chronic     Delman Kitten, MD 03/26/15 2320

## 2015-03-26 NOTE — ED Notes (Signed)
Pt arrived via EMS from home with son reporting inability to swallow today. Pt has had difficulty swallowing for the past few months and is being seen by MD for this . Today pt reports inability to swallow soup. Pts son reports decreased fluid intake and decreased appetitie as well as weight loss over the past year.

## 2015-03-27 ENCOUNTER — Other Ambulatory Visit: Payer: Self-pay

## 2015-03-27 ENCOUNTER — Encounter: Payer: Self-pay | Admitting: Emergency Medicine

## 2015-03-27 LAB — URINALYSIS COMPLETE WITH MICROSCOPIC (ARMC ONLY)
BACTERIA UA: NONE SEEN
Bilirubin Urine: NEGATIVE
Glucose, UA: NEGATIVE mg/dL
KETONES UR: NEGATIVE mg/dL
Leukocytes, UA: NEGATIVE
NITRITE: NEGATIVE
Protein, ur: 30 mg/dL — AB
Specific Gravity, Urine: 1.018 (ref 1.005–1.030)
pH: 5 (ref 5.0–8.0)

## 2015-03-27 LAB — BASIC METABOLIC PANEL
Anion gap: 8 (ref 5–15)
BUN: 42 mg/dL — ABNORMAL HIGH (ref 6–20)
CO2: 24 mmol/L (ref 22–32)
CREATININE: 1.86 mg/dL — AB (ref 0.61–1.24)
Calcium: 8.6 mg/dL — ABNORMAL LOW (ref 8.9–10.3)
Chloride: 107 mmol/L (ref 101–111)
GFR calc Af Amer: 34 mL/min — ABNORMAL LOW (ref >60)
GFR, EST NON AFRICAN AMERICAN: 29 mL/min — AB (ref >60)
GLUCOSE: 108 mg/dL — AB (ref 65–99)
Potassium: 4.3 mmol/L (ref 3.5–5.1)
Sodium: 139 mmol/L (ref 135–145)

## 2015-03-27 LAB — CBC
HEMATOCRIT: 27.8 % — AB (ref 40.0–52.0)
Hemoglobin: 9 g/dL — ABNORMAL LOW (ref 13.0–18.0)
MCH: 30.4 pg (ref 26.0–34.0)
MCHC: 32.2 g/dL (ref 32.0–36.0)
MCV: 94.4 fL (ref 80.0–100.0)
Platelets: 189 10*3/uL (ref 150–440)
RBC: 2.94 MIL/uL — AB (ref 4.40–5.90)
RDW: 13.7 % (ref 11.5–14.5)
WBC: 11.2 10*3/uL — ABNORMAL HIGH (ref 3.8–10.6)

## 2015-03-27 NOTE — ED Provider Notes (Signed)
-----------------------------------------   2:41 AM on 03/27/2015 -----------------------------------------  Assuming care from Dr. Jacqualine Code.  In short, [name] is a 81 y.o. male with a chief complaint of Dysphagia .  Refer to the original H&P for additional details.  The current plan of care is to follow up the ECG and the pending blood work.  ED ECG REPORT   Date: 03/27/2015  EKG Time: 256  Rate: 25  Rhythm: normal EKG, normal sinus rhythm, paced rhythm  Axis: Unable to assess axis due to pacer  Intervals:none paced rhythm  ST&T Change: None  Patient's urinalysis is unremarkable with no bacteria the patient's CBC shows a white blood cell count of 11.2 and hemoglobin of 9 and a hematocrit of 27.8 The patient's creatinine is 1.8 and the patient's GFR 34  The patient will be discharged home to follow-up with his primary care physician as his workup is unremarkable. The patient's son does agree with this plan and will take the patient home to follow-up.   [name], MD 03/27/15 [phone]

## 2015-03-27 NOTE — Discharge Instructions (Signed)

## 2015-03-28 ENCOUNTER — Emergency Department: Payer: Medicare Other

## 2015-03-28 ENCOUNTER — Encounter: Payer: Self-pay | Admitting: Emergency Medicine

## 2015-03-28 ENCOUNTER — Observation Stay
Admission: EM | Admit: 2015-03-28 | Discharge: 2015-03-29 | Disposition: A | Discharge status: Home / Self Care | Payer: Medicare Other | Attending: Internal Medicine | Admitting: Internal Medicine

## 2015-03-28 DIAGNOSIS — I4510 Unspecified right bundle-branch block: Secondary | ICD-10-CM | POA: Insufficient documentation

## 2015-03-28 DIAGNOSIS — R05 Cough: Secondary | ICD-10-CM | POA: Diagnosis not present

## 2015-03-28 DIAGNOSIS — I340 Nonrheumatic mitral (valve) insufficiency: Secondary | ICD-10-CM | POA: Insufficient documentation

## 2015-03-28 DIAGNOSIS — I5022 Chronic systolic (congestive) heart failure: Secondary | ICD-10-CM | POA: Insufficient documentation

## 2015-03-28 DIAGNOSIS — R55 Syncope and collapse: Secondary | ICD-10-CM | POA: Insufficient documentation

## 2015-03-28 DIAGNOSIS — M1990 Unspecified osteoarthritis, unspecified site: Secondary | ICD-10-CM | POA: Diagnosis not present

## 2015-03-28 DIAGNOSIS — I2510 Atherosclerotic heart disease of native coronary artery without angina pectoris: Secondary | ICD-10-CM | POA: Diagnosis not present

## 2015-03-28 DIAGNOSIS — Z823 Family history of stroke: Secondary | ICD-10-CM | POA: Diagnosis not present

## 2015-03-28 DIAGNOSIS — I351 Nonrheumatic aortic (valve) insufficiency: Secondary | ICD-10-CM | POA: Insufficient documentation

## 2015-03-28 DIAGNOSIS — R1312 Dysphagia, oropharyngeal phase: Secondary | ICD-10-CM | POA: Diagnosis not present

## 2015-03-28 DIAGNOSIS — E739 Lactose intolerance, unspecified: Secondary | ICD-10-CM | POA: Diagnosis not present

## 2015-03-28 DIAGNOSIS — I10 Essential (primary) hypertension: Secondary | ICD-10-CM | POA: Insufficient documentation

## 2015-03-28 DIAGNOSIS — D649 Anemia, unspecified: Secondary | ICD-10-CM | POA: Diagnosis not present

## 2015-03-28 DIAGNOSIS — R312 Other microscopic hematuria: Secondary | ICD-10-CM | POA: Insufficient documentation

## 2015-03-28 DIAGNOSIS — E860 Dehydration: Secondary | ICD-10-CM | POA: Diagnosis not present

## 2015-03-28 DIAGNOSIS — I255 Ischemic cardiomyopathy: Secondary | ICD-10-CM | POA: Insufficient documentation

## 2015-03-28 DIAGNOSIS — R531 Weakness: Secondary | ICD-10-CM | POA: Insufficient documentation

## 2015-03-28 DIAGNOSIS — Z91018 Allergy to other foods: Secondary | ICD-10-CM | POA: Diagnosis not present

## 2015-03-28 DIAGNOSIS — I951 Orthostatic hypotension: Secondary | ICD-10-CM | POA: Diagnosis not present

## 2015-03-28 DIAGNOSIS — Z79899 Other long term (current) drug therapy: Secondary | ICD-10-CM | POA: Insufficient documentation

## 2015-03-28 DIAGNOSIS — E785 Hyperlipidemia, unspecified: Secondary | ICD-10-CM | POA: Diagnosis present

## 2015-03-28 DIAGNOSIS — Z950 Presence of cardiac pacemaker: Secondary | ICD-10-CM | POA: Diagnosis not present

## 2015-03-28 DIAGNOSIS — R1310 Dysphagia, unspecified: Principal | ICD-10-CM | POA: Insufficient documentation

## 2015-03-28 DIAGNOSIS — J449 Chronic obstructive pulmonary disease, unspecified: Secondary | ICD-10-CM | POA: Insufficient documentation

## 2015-03-28 DIAGNOSIS — Z8042 Family history of malignant neoplasm of prostate: Secondary | ICD-10-CM | POA: Insufficient documentation

## 2015-03-28 DIAGNOSIS — J849 Interstitial pulmonary disease, unspecified: Secondary | ICD-10-CM | POA: Diagnosis not present

## 2015-03-28 DIAGNOSIS — Z8249 Family history of ischemic heart disease and other diseases of the circulatory system: Secondary | ICD-10-CM | POA: Insufficient documentation

## 2015-03-28 DIAGNOSIS — R627 Adult failure to thrive: Secondary | ICD-10-CM | POA: Diagnosis not present

## 2015-03-28 DIAGNOSIS — I129 Hypertensive chronic kidney disease with stage 1 through stage 4 chronic kidney disease, or unspecified chronic kidney disease: Secondary | ICD-10-CM | POA: Diagnosis not present

## 2015-03-28 DIAGNOSIS — N184 Chronic kidney disease, stage 4 (severe): Secondary | ICD-10-CM | POA: Insufficient documentation

## 2015-03-28 DIAGNOSIS — E559 Vitamin D deficiency, unspecified: Secondary | ICD-10-CM | POA: Diagnosis not present

## 2015-03-28 DIAGNOSIS — R634 Abnormal weight loss: Secondary | ICD-10-CM | POA: Diagnosis not present

## 2015-03-28 DIAGNOSIS — Z888 Allergy status to other drugs, medicaments and biological substances status: Secondary | ICD-10-CM | POA: Diagnosis not present

## 2015-03-28 DIAGNOSIS — Z9049 Acquired absence of other specified parts of digestive tract: Secondary | ICD-10-CM | POA: Diagnosis not present

## 2015-03-28 DIAGNOSIS — R1319 Other dysphagia: Secondary | ICD-10-CM | POA: Diagnosis not present

## 2015-03-28 DIAGNOSIS — E43 Unspecified severe protein-calorie malnutrition: Secondary | ICD-10-CM | POA: Insufficient documentation

## 2015-03-28 HISTORY — DX: Dysphagia, unspecified: R13.10

## 2015-03-28 LAB — BASIC METABOLIC PANEL
ANION GAP: 6 (ref 5–15)
BUN: 37 mg/dL — AB (ref 6–20)
CHLORIDE: 108 mmol/L (ref 101–111)
CO2: 25 mmol/L (ref 22–32)
Calcium: 8.7 mg/dL — ABNORMAL LOW (ref 8.9–10.3)
Creatinine, Ser: 1.86 mg/dL — ABNORMAL HIGH (ref 0.61–1.24)
GFR calc Af Amer: 34 mL/min — ABNORMAL LOW (ref >60)
GFR calc non Af Amer: 29 mL/min — ABNORMAL LOW (ref >60)
Glucose, Bld: 92 mg/dL (ref 65–99)
POTASSIUM: 4.4 mmol/L (ref 3.5–5.1)
Sodium: 139 mmol/L (ref 135–145)

## 2015-03-28 LAB — CBC
HCT: 28.5 % — ABNORMAL LOW (ref 40.0–52.0)
Hemoglobin: 9.1 g/dL — ABNORMAL LOW (ref 13.0–18.0)
MCH: 30.2 pg (ref 26.0–34.0)
MCHC: 32.1 g/dL (ref 32.0–36.0)
MCV: 94.2 fL (ref 80.0–100.0)
PLATELETS: 180 10*3/uL (ref 150–440)
RBC: 3.02 MIL/uL — ABNORMAL LOW (ref 4.40–5.90)
RDW: 14 % (ref 11.5–14.5)
WBC: 8.8 10*3/uL (ref 3.8–10.6)

## 2015-03-28 MED ORDER — PREDNISONE 20 MG PO TABS
ORAL_TABLET | ORAL | Status: AC
  Filled 2015-03-28: qty 2

## 2015-03-28 MED ORDER — SODIUM CHLORIDE 0.9 % IV SOLN
INTRAVENOUS | Status: DC
  Administered 2015-03-28: via INTRAVENOUS

## 2015-03-28 MED ORDER — ACETAMINOPHEN 325 MG PO TABS: 650.0000 mg | ORAL_TABLET | Freq: Four times a day (QID) | ORAL | Status: DC | PRN

## 2015-03-28 MED ORDER — SENNOSIDES-DOCUSATE SODIUM 8.6-50 MG PO TABS: 1.0000 | ORAL_TABLET | Freq: Every evening | ORAL | Status: DC | PRN

## 2015-03-28 MED ORDER — HEPARIN SODIUM (PORCINE) 5000 UNIT/ML IJ SOLN: 5000.0000 [IU] | Freq: Three times a day (TID) | INTRAMUSCULAR | Status: DC

## 2015-03-28 MED ORDER — ACETAMINOPHEN 650 MG RE SUPP: 650.0000 mg | Freq: Four times a day (QID) | RECTAL | Status: DC | PRN

## 2015-03-28 MED ORDER — CARVEDILOL 6.25 MG PO TABS: 6.2500 mg | ORAL_TABLET | Freq: Two times a day (BID) | ORAL | Status: DC

## 2015-03-28 MED ORDER — ATORVASTATIN CALCIUM 20 MG PO TABS: 20.0000 mg | ORAL_TABLET | Freq: Every day | ORAL | Status: DC

## 2015-03-28 MED ORDER — FERROUS SULFATE 325 (65 FE) MG PO TABS: 325.0000 mg | ORAL_TABLET | Freq: Every day | ORAL | Status: DC

## 2015-03-28 NOTE — ED Notes (Signed)
Patient arrived with family after being seen at [facility] office for dysphagia.  PCP sent patient over to ED to be evaluated and admitted and seen by Dr. Felton Clinton.

## 2015-03-28 NOTE — H&P (Signed)
[facility] [place] NAME: Jonathan Harrell    MR#:  [id number]  DATE OF BIRTH:  [birth year]  DATE OF ADMISSION:  03/28/2015  PRIMARY CARE PHYSICIAN: Cletis Athens, MD   REQUESTING/REFERRING PHYSICIAN: Lord  CHIEF COMPLAINT:   Chief Complaint  Patient presents with  . Dysphagia    HISTORY OF PRESENT ILLNESS:  Jonathan Harrell  is a 81 y.o. male who presents with failure to thrive due to progressive dysphagia and significantly decreased by mouth intake. Patient has had dysphagia for a number of years now, and it has gotten progressively worse to the point now that he has difficulty even swallowing his own saliva. He went to see his PCP Dr. Lavera Guise about this, and was told to come to the ED for admission and evaluation by gastroenterology as his PCP was very concerned that he was extremely high risk for aspiration after watching him try to swallow small amount of ensure drink. Patient's son and daughter are in the room with him, and stated that he has had significant weight loss, decreased strength, and overall progressive lethargy, especially over the last several months. Patient is to the point now that he has difficulty swallowing solids and liquids, even very thin liquids. Patient is arousable and very pleasant though he is a very thin and frail looking elderly gentleman.  PAST MEDICAL HISTORY:   Past Medical History  Diagnosis Date  . Anemia 07/14/2004  . Coronary artery disease     a. Cath in 2010->3VD->BMS to OM. CABG not felt to be option 2/2 age.  . Hyperlipemia 08/1999  . Hypertension 10/2002  . Osteoarthritis 09/2001  . History of echocardiogram 12/18/2003    Mild M. R.; MOD AI; MILD TR  . Syncope     a. Orthostatic in setting of volume depletion/diarrhea 04/2014.  Marland Kitchen History of CT scan of head 07/26/2005    Within normal limits/Carotid Ultrasound WNL 07/27/05  . Moderate mitral regurgitation   . Chronic kidney disease   .  Moderate aortic regurgitation   . Ischemic cardiomyopathy     a. 04/2014 Echo: EF 35-40%, mod MR/AI.  Marland Kitchen Dysphagia     PAST SURGICAL HISTORY:   Past Surgical History  Procedure Laterality Date  . Cholecystectomy  1980  . Insert / replace / remove pacemaker    . Cardiac catheterization  11/2008    [facility]  . Cardiac catheterization  12/2008    [facility]    SOCIAL HISTORY:   History  Substance Use Topics  . Smoking status: Never Smoker   . Smokeless tobacco: Not on file  . Alcohol Use: No    FAMILY HISTORY:   Family History  Problem Relation Age of Onset  . Hypertension Mother   . Stroke Mother   . Cancer Father     prostate    DRUG ALLERGIES:   Allergies  Allergen Reactions  . Cortisone Other (See Comments)    Reaction:  Unknown   . Lactose Other (See Comments)    Pt is lactose intolerant.      MEDICATIONS AT HOME:   Prior to Admission medications   Medication Sig Start Date End Date Taking? Authorizing Provider  atorvastatin (LIPITOR) 20 MG tablet Take 20 mg by mouth daily.   Yes Historical Provider, MD  carvedilol (COREG) 6.25 MG tablet Take 6.25 mg by mouth 2 (two) times daily.    Yes Historical Provider, MD  ferrous sulfate 325 (65 FE) MG tablet Take 325 mg  by mouth daily.    Yes Historical Provider, MD  Multiple Vitamins-Minerals (MULTIVITAMIN PO) Take 1 each by mouth daily.   Yes Historical Provider, MD    REVIEW OF SYSTEMS:  Review of Systems  Constitutional: Positive for weight loss. Negative for fever, chills and malaise/fatigue.  HENT: Negative for ear pain, hearing loss and tinnitus.   Eyes: Negative for blurred vision, double vision, pain and redness.  Respiratory: Negative for cough, hemoptysis and shortness of breath.   Cardiovascular: Negative for chest pain, palpitations, orthopnea and leg swelling.  Gastrointestinal: Negative for nausea, vomiting, abdominal pain, diarrhea and constipation.  Genitourinary: Negative for dysuria, frequency and  hematuria.  Skin:       No acne, rash, or lesions  Neurological: Positive for weakness. Negative for dizziness, tremors and focal weakness.  Endo/Heme/Allergies: Negative for polydipsia. Does not bruise/bleed easily.  Psychiatric/Behavioral: Negative for depression. The patient is not nervous/anxious and does not have insomnia.      VITAL SIGNS:   Filed Vitals:   03/28/15 1507 03/28/15 1800 03/28/15 1830  BP: 143/55 164/67 163/62  Pulse: 76 70 69  Temp: 97.6 F (36.4 C)    TempSrc: Oral    Resp: 16    Height: 5\' 11"  (1.803 m)    Weight: 50.349 kg (111 lb)    SpO2: 98% 99% 98%   Wt Readings from Last 3 Encounters:  03/28/15 50.349 kg (111 lb)  03/26/15 50.349 kg (111 lb)  04/24/14 59.875 kg (132 lb)    PHYSICAL EXAMINATION:  Physical Exam  Constitutional: He is oriented to person, place, and time. He appears well-developed. No distress.  cachectic  HENT:  Head: Normocephalic and atraumatic.  Mouth/Throat: Oropharynx is clear and moist.  Eyes: Conjunctivae and EOM are normal. Pupils are equal, round, and reactive to light. No scleral icterus.  Neck: Normal range of motion. Neck supple. No JVD present. No thyromegaly present.  Cardiovascular: Normal rate, regular rhythm and intact distal pulses.  Exam reveals no gallop and no friction rub.   Murmur (II/VI systolic) heard. Respiratory: Effort normal and breath sounds normal. No respiratory distress. He has no wheezes. He has no rales.  GI: Soft. Bowel sounds are normal. He exhibits no distension. There is no tenderness.  Musculoskeletal: Normal range of motion. He exhibits no edema.  No arthritis, no gout  Lymphadenopathy:    He has no cervical adenopathy.  Neurological: He is alert and oriented to person, place, and time. No cranial nerve deficit.  No dysarthria, no aphasia  Skin: Skin is warm and dry. No rash noted. No erythema.  Psychiatric: He has a normal mood and affect. His behavior is normal. Judgment and thought  content normal.    LABORATORY PANEL:   CBC  Recent Labs Lab 03/28/15 1457  WBC 8.8  HGB 9.1*  HCT 28.5*  PLT 180   ------------------------------------------------------------------------------------------------------------------  Chemistries   Recent Labs Lab 03/28/15 1457  NA 139  K 4.4  CL 108  CO2 25  GLUCOSE 92  BUN 37*  CREATININE 1.86*  CALCIUM 8.7*   ------------------------------------------------------------------------------------------------------------------  Cardiac Enzymes No results for input(s): TROPONINI in the last 168 hours. ------------------------------------------------------------------------------------------------------------------  RADIOLOGY:  Dg Chest 2 View  03/26/2015   CLINICAL DATA:  Inability to swallow today.  Weight loss.  EXAM: CHEST  2 VIEW  COMPARISON:  11/17/2014  FINDINGS: There are intact appearances of the transvenous leads. Heart size is normal and unchanged. Hilar and mediastinal contours are normal and unchanged. Severe emphysematous and fibrotic  changes are again evident, without superimposed acute infiltrate or congestive failure. There is no effusion.  IMPRESSION: Severe emphysematous and fibrotic changes without evidence of an acute superimposed process.   Electronically Signed   By: Andreas Newport M.D.   On: 03/26/2015 22:54   Dg Chest Port 1 View  03/28/2015   CLINICAL DATA:  Dysphagia.  Cough.  EXAM: PORTABLE CHEST - 1 VIEW  COMPARISON:  03/26/2015 and 11/17/2014  FINDINGS: There is tortuosity of the thoracic aorta. Overall heart size is normal. The patient has severe chronic interstitial and obstructive lung disease. Dual lead pacemaker in place. No acute infiltrates or effusions. No acute osseous abnormality.  IMPRESSION: No acute abnormalities.  Severe chronic lung disease.   Electronically Signed   By: Lorriane Shire M.D.   On: 03/28/2015 19:25    EKG:   Orders placed or performed in visit on 03/27/15  . EKG  12-Lead    IMPRESSION AND PLAN:  Principal Problem:   Failure to thrive in adult - seems to be due solely to the patient's dysphagia and difficulty swallowing. In communicating, patient seems to be in good spirits and is very pleasant. Address primary underlying problem as below. Active Problems:   Dysphagia - GI consult, speech swallow evaluation, dysphagia 1 diet for now. Family and patient would like to know there options to improve by mouth intake.   Essential hypertension, benign - controlled here, chronic stable problem continue home meds and monitor   Coronary artery disease - chronic stable problem continue home meds   CKD (chronic kidney disease) stage 4, GFR 15-29 ml/min - avoid renal toxins, chronic stable problem, monitor   Hyperlipidemia - chronic stable problem, continue home meds  All the records are reviewed and case discussed with ED provider. Management plans discussed with the patient and/or family.  DVT PROPHYLAXIS: SubQ heparin  ADMISSION STATUS: Observation  CODE STATUS: DO NOT RESUSCITATE Advance Directive Documentation        Most Recent Value   Type of Advance Directive  Living will, Healthcare Power of Attorney   Pre-existing out of facility DNR order (yellow form or pink MOST form)     "MOST" Form in Place?        TOTAL TIME TAKING CARE OF THIS PATIENT: 45 minutes.    Jonathan Harrell [place] 03/28/2015, 9:17 PM  [facility]  Office  [phone]  CC: Primary care physician; Cletis Athens, MD

## 2015-03-28 NOTE — ED Provider Notes (Signed)
[facility] Emergency Department Provider Note   ____________________________________________  Time seen: 7 PM  I have reviewed the triage vital signs and the triage nursing note.   HISTORY  Chief Complaint Dysphagia   History provided by patient and his son HPI Jonathan Harrell is a 81 y.o. male who at baseline has been very functional with no significant dementia and lives on his own. He has suffered with trouble swallowing and inability to take in solid foods for several months now. This has seemed to become worse over the last few weeks where he has trouble gagging and coughing with applesauce type foods. Over the past 3 days he is gagging and coughing on liquids including water and ensure.  He was seenin the emergency department 2 days ago and diagnosed with dehydration. He was seen at his primary care doctor's office this afternoon and witnessed to be aspirating with any liquid intake. The patient has lost weight over the past several months. He is failing to thrive based on his inability to take in any solids or liquids food. He is not reporting any chest pain. No recent fevers. Symptoms are moderate to severe.      Past Medical History  Diagnosis Date  . Anemia 07/14/2004  . Coronary artery disease     a. Cath in 2010->3VD->BMS to OM. CABG not felt to be option 2/2 age.  . Hyperlipemia 08/1999  . Hypertension 10/2002  . Osteoarthritis 09/2001  . History of echocardiogram 12/18/2003    Mild M. R.; MOD AI; MILD TR  . Syncope     a. Orthostatic in setting of volume depletion/diarrhea 04/2014.  Marland Kitchen History of CT scan of head 07/26/2005    Within normal limits/Carotid Ultrasound WNL 07/27/05  . Moderate mitral regurgitation   . Chronic kidney disease   . Moderate aortic regurgitation   . Ischemic cardiomyopathy     a. 04/2014 Echo: EF 35-40%, mod MR/AI.    Patient Active Problem List   Diagnosis Date Noted  . Ischemic cardiomyopathy   . Syncope    . Moderate mitral regurgitation   . Moderate aortic regurgitation   . Coronary artery disease   . VITAMIN D DEFICIENCY 03/20/2010  . DIARRHEA 03/06/2010  . NEVI, MULTIPLE 10/24/2008  . HYPERLIPIDEMIA 10/24/2008  . ANEMIA-NOS 10/24/2008  . HYPERTENSION 10/24/2008  . CORONARY ARTERY DISEASE 10/24/2008  . RBBB 10/24/2008  . MICROSCOPIC HEMATURIA 10/24/2008  . OSTEOARTHRITIS 10/24/2008  . SYNCOPE, HX OF 10/24/2008  . Hypertension 10/16/2002  . Hyperlipemia 08/17/1999    Past Surgical History  Procedure Laterality Date  . Cholecystectomy  1980  . Insert / replace / remove pacemaker    . [facility]iac [facility]  [facility]/2008    [facility]  . Cardiac catheterization  12/2008    [facility]    Current Outpatient Rx  Name  Route  Sig  Dispense  Refill  . atorvastatin (LIPITOR) 20 MG tablet   Oral   Take 20 mg by mouth daily.         . carvedilol (COREG) 6.25 MG tablet   Oral   Take 6.25 mg by mouth 2 (two) times daily.          . ferrous sulfate 325 (65 FE) MG tablet   Oral   Take 325 mg by mouth daily.          . Multiple Vitamins-Minerals (MULTIVITAMIN PO)   Oral   Take 1 each by mouth daily.  Allergies Cortisone and Lactose  Family History  Problem Relation Age of Onset  . Hypertension Mother   . Stroke Mother   . Cancer Father     prostate    Social History History  Substance Use Topics  . Smoking status: Never Smoker   . Smokeless tobacco: Not on file  . Alcohol Use: No    Review of Systems  Constitutional: Negative for fever. Positive for weight loss Eyes: Negative for visual changes. ENT: Negative for sore throat. Cardiovascular: Negative for chest pain. Respiratory: Negative for shortness of breath. Gastrointestinal: Negative for abdominal pain, vomiting and diarrhea. Genitourinary: Negative for dysuria. Musculoskeletal: Negative for back pain. Skin: Negative for rash. Neurological: Negative for headaches, focal weakness or  numbness.   ____________________________________________   PHYSICAL EXAM:  VITAL SIGNS: ED Triage Vitals  Enc Vitals Group     BP 03/28/15 1507 143/55 mmHg     Pulse Rate 03/28/15 1507 76     Resp 03/28/15 1507 16     Temp 03/28/15 1507 97.6 F (36.4 C)     Temp Source 03/28/15 1507 Oral     SpO2 03/28/15 1507 98 %     Weight 03/28/15 1507 111 lb (50.349 kg)     Height 03/28/15 1507 5\' 11"  (1.803 m)     Head Cir --      Peak Flow --      Pain Score --      Pain Loc --      Pain Edu? --      Excl. in [place]? --      Constitutional: Alert and oriented. Very cachectic Eyes: Conjunctivae are normal. PERRL. Normal extraocular movements. ENT   Head: Normocephalic and atraumatic.   Nose: No congestion/rhinnorhea.   Mouth/Throat: Mucous membranes are mildly dry.   Neck: No stridor. Cardiovascular: Normal rate, regular rhythm.  No murmurs, rubs, or gallops. Respiratory: Normal respiratory effort without tachypnea nor retractions. Breath sounds are clear and equal bilaterally. No wheezes/rales/rhonchi. Gastrointestinal: Soft and nontender. No distention.  Genitourinary: Musculoskeletal: Nontender with normal range of motion in all extremities. No joint effusions.  No lower extremity tenderness nor edema. Neurologic:  Normal speech and language. No gross focal neurologic deficits are appreciated. Speech is normal. No gait instability. Heart appearing Skin:  Skin is warm, dry and intact. No rash noted. Psychiatric: Mood and affect are normal. Speech and behavior are normal. Patient exhibits appropriate insight and judgment.  ____________________________________________   EKG     ____________________________________________   LABS (pertinent positives/negatives)   Hemoglobin 9.1 with a normal white blood cell count of 8.8 Elect lites within normal limits. BUN 37 and 21.86  ____________________________________________    RADIOLOGY Radiologist results  reviewed  Chest x-ray: No acute abnormalities with severe chronic lung disease  ____________________________________________   PROCEDURES  Procedure(s) performed:  Critical Care performed:   ____________________________________________   INITIAL IMPRESSION / ASSESSMENT AND PLAN / ED COURSE  Pertinent labs & imaging results that were available during my care of the patient were reviewed by me and considered in my medical decision making (see chart for details).   The patient is unable taken any solids mashed or liquid foods at this point in time with active and obvious aspiration each time he feeds. He needs IV hydration and expedited workup and management for hydration and nutrition. I did discuss with GI doctor Cool who would be consult in the hospital for possible PEG placement after upper endoscopy and speech therapy evaluation and recommendations. Family  was updated. Hospitalist was consultation for admission.  ____________________________________________   FINAL CLINICAL IMPRESSION(S) / ED DIAGNOSES   Failure to thrive Severe dysphagia with inability to take in solids or soft foods Aspiration of liquids    Lisa Roca, MD 03/28/15 2035

## 2015-03-29 ENCOUNTER — Encounter: Payer: Self-pay | Admitting: Gastroenterology

## 2015-03-29 ENCOUNTER — Observation Stay: Payer: Medicare Other

## 2015-03-29 DIAGNOSIS — R1310 Dysphagia, unspecified: Secondary | ICD-10-CM

## 2015-03-29 DIAGNOSIS — E785 Hyperlipidemia, unspecified: Secondary | ICD-10-CM

## 2015-03-29 DIAGNOSIS — I2510 Atherosclerotic heart disease of native coronary artery without angina pectoris: Secondary | ICD-10-CM

## 2015-03-29 DIAGNOSIS — M4182 Other forms of scoliosis, cervical region: Secondary | ICD-10-CM | POA: Diagnosis not present

## 2015-03-29 DIAGNOSIS — I10 Essential (primary) hypertension: Secondary | ICD-10-CM | POA: Diagnosis not present

## 2015-03-29 DIAGNOSIS — J984 Other disorders of lung: Secondary | ICD-10-CM

## 2015-03-29 DIAGNOSIS — M1990 Unspecified osteoarthritis, unspecified site: Secondary | ICD-10-CM

## 2015-03-29 DIAGNOSIS — M47812 Spondylosis without myelopathy or radiculopathy, cervical region: Secondary | ICD-10-CM | POA: Diagnosis not present

## 2015-03-29 DIAGNOSIS — Z79899 Other long term (current) drug therapy: Secondary | ICD-10-CM

## 2015-03-29 DIAGNOSIS — M5031 Other cervical disc degeneration,  high cervical region: Secondary | ICD-10-CM | POA: Diagnosis not present

## 2015-03-29 DIAGNOSIS — R1312 Dysphagia, oropharyngeal phase: Secondary | ICD-10-CM | POA: Diagnosis not present

## 2015-03-29 DIAGNOSIS — I129 Hypertensive chronic kidney disease with stage 1 through stage 4 chronic kidney disease, or unspecified chronic kidney disease: Secondary | ICD-10-CM

## 2015-03-29 DIAGNOSIS — R627 Adult failure to thrive: Secondary | ICD-10-CM

## 2015-03-29 DIAGNOSIS — Z515 Encounter for palliative care: Secondary | ICD-10-CM

## 2015-03-29 DIAGNOSIS — R634 Abnormal weight loss: Secondary | ICD-10-CM | POA: Diagnosis not present

## 2015-03-29 DIAGNOSIS — Z66 Do not resuscitate: Secondary | ICD-10-CM

## 2015-03-29 DIAGNOSIS — N183 Chronic kidney disease, stage 3 (moderate): Secondary | ICD-10-CM

## 2015-03-29 DIAGNOSIS — E43 Unspecified severe protein-calorie malnutrition: Secondary | ICD-10-CM | POA: Insufficient documentation

## 2015-03-29 DIAGNOSIS — E860 Dehydration: Secondary | ICD-10-CM | POA: Diagnosis not present

## 2015-03-29 DIAGNOSIS — R1314 Dysphagia, pharyngoesophageal phase: Secondary | ICD-10-CM | POA: Diagnosis not present

## 2015-03-29 LAB — BASIC METABOLIC PANEL
ANION GAP: 6 (ref 5–15)
BUN: 37 mg/dL — AB (ref 6–20)
CHLORIDE: 109 mmol/L (ref 101–111)
CO2: 23 mmol/L (ref 22–32)
Calcium: 8.2 mg/dL — ABNORMAL LOW (ref 8.9–10.3)
Creatinine, Ser: 1.83 mg/dL — ABNORMAL HIGH (ref 0.61–1.24)
GFR calc Af Amer: 35 mL/min — ABNORMAL LOW (ref >60)
GFR calc non Af Amer: 30 mL/min — ABNORMAL LOW (ref >60)
Glucose, Bld: 78 mg/dL (ref 65–99)
Potassium: 4.5 mmol/L (ref 3.5–5.1)
SODIUM: 138 mmol/L (ref 135–145)

## 2015-03-29 LAB — PHOSPHORUS: PHOSPHORUS: 3.4 mg/dL (ref 2.5–4.6)

## 2015-03-29 LAB — CBC
HCT: 24.7 % — ABNORMAL LOW (ref 40.0–52.0)
HEMOGLOBIN: 8 g/dL — AB (ref 13.0–18.0)
MCH: 30.8 pg (ref 26.0–34.0)
MCHC: 32.5 g/dL (ref 32.0–36.0)
MCV: 94.9 fL (ref 80.0–100.0)
PLATELETS: 155 10*3/uL (ref 150–440)
RBC: 2.6 MIL/uL — ABNORMAL LOW (ref 4.40–5.90)
RDW: 13.8 % (ref 11.5–14.5)
WBC: 8 10*3/uL (ref 3.8–10.6)

## 2015-03-29 LAB — CREATININE, SERUM
Creatinine, Ser: 1.82 mg/dL — ABNORMAL HIGH (ref 0.61–1.24)
GFR calc Af Amer: 35 mL/min — ABNORMAL LOW (ref >60)
GFR calc non Af Amer: 30 mL/min — ABNORMAL LOW (ref >60)

## 2015-03-29 LAB — MAGNESIUM: Magnesium: 1.7 mg/dL (ref 1.7–2.4)

## 2015-03-29 MED ORDER — FERROUS SULFATE 325 (65 FE) MG PO TABS
325.0000 mg | ORAL_TABLET | Freq: Every day | ORAL | Status: DC
  Filled 2015-03-29: qty 1

## 2015-03-29 MED ORDER — CARVEDILOL 6.25 MG PO TABS
6.2500 mg | ORAL_TABLET | Freq: Two times a day (BID) | ORAL | Status: DC
  Filled 2015-03-29: qty 1

## 2015-03-29 MED ORDER — ATORVASTATIN CALCIUM 40 MG PO TABS: 20.0000 mg | ORAL_TABLET | Freq: Every day | ORAL | Status: DC

## 2015-03-29 NOTE — Progress Notes (Signed)
[facility] [place] NAME: Jonathan Harrell    MR#:  [id number]  DATE OF BIRTH:  [birth year]  SUBJECTIVE:   Comfortable currently. No complaints.  REVIEW OF SYSTEMS:  CONSTITUTIONAL: No fever, fatigue or weakness.  EYES: No blurred or double vision.  EARS, NOSE, AND THROAT: No tinnitus or ear pain.  RESPIRATORY: Does have coughing after eating, shortness of breath with ambulation CARDIOVASCULAR: No chest pain, orthopnea, edema.  GASTROINTESTINAL: No nausea, vomiting, diarrhea or abdominal pain.  GENITOURINARY: No dysuria, hematuria.  ENDOCRINE: No polyuria, nocturia,  HEMATOLOGY: No anemia, easy bruising or bleeding SKIN: No rash or lesion. MUSCULOSKELETAL: No joint pain or arthritis.   NEUROLOGIC: No tingling, numbness, weakness.  PSYCHIATRY: No anxiety or depression.   DRUG ALLERGIES:   Allergies  Allergen Reactions  . Cortisone Other (See Comments)    Reaction:  Unknown   . Lactose Other (See Comments)    Pt is lactose intolerant.      VITALS:  Blood pressure 131/48, pulse 73, temperature 98.1 F (36.7 C), temperature source Oral, resp. rate 17, height 5\' 11"  (1.803 m), weight 49.125 kg (108 lb 4.8 oz), SpO2 96 %.  PHYSICAL EXAMINATION:  GENERAL:  81 y.o.-year-old patient lying in the bed with no acute distress. Thin EYES: Pupils equal, round, reactive to light and accommodation. No scleral icterus. Extraocular muscles intact.  HEENT: Head atraumatic, normocephalic. Oropharynx and nasopharynx clear.  NECK:  Supple, no jugular venous distention. No thyroid enlargement, no tenderness. Has a very large Adam's apple  LUNGS: Scattered crackles and rhonchi, no respiratory distress or difficulty breathing no use of accessory muscles  CARDIOVASCULAR: S1, S2 normal. No murmurs, rubs, or gallops.  ABDOMEN: Soft, nontender, nondistended. Bowel sounds present. No organomegaly or mass. Thin EXTREMITIES: No pedal edema, cyanosis,  or clubbing.  NEUROLOGIC: Cranial nerves II through XII are intact. Muscle strength 5/5 in all extremities. Sensation intact. Gait not checked.  PSYCHIATRIC: The patient is alert and oriented x 3.  SKIN: No obvious rash, lesion, or ulcer.    LABORATORY PANEL:   CBC  Recent Labs Lab 03/29/15 0121  WBC 8.0  HGB 8.0*  HCT 24.7*  PLT 155   ------------------------------------------------------------------------------------------------------------------  Chemistries   Recent Labs Lab 03/29/15 0121 03/29/15 0122  NA  --  138  K  --  4.5  CL  --  109  CO2  --  23  GLUCOSE  --  78  BUN  --  37*  CREATININE 1.82* 1.83*  CALCIUM  --  8.2*  MG 1.7  --    ------------------------------------------------------------------------------------------------------------------  Cardiac Enzymes No results for input(s): TROPONINI in the last 168 hours. ------------------------------------------------------------------------------------------------------------------  RADIOLOGY:  Dg Chest Port 1 View  03/28/2015   CLINICAL DATA:  Dysphagia.  Cough.  EXAM: PORTABLE CHEST - 1 VIEW  COMPARISON:  03/26/2015 and 11/17/2014  FINDINGS: There is tortuosity of the thoracic aorta. Overall heart size is normal. The patient has severe chronic interstitial and obstructive lung disease. Dual lead pacemaker in place. No acute infiltrates or effusions. No acute osseous abnormality.  IMPRESSION: No acute abnormalities.  Severe chronic lung disease.   Electronically Signed   By: Lorriane Shire M.D.   On: 03/28/2015 19:25    EKG:   Orders placed or performed in visit on 03/27/15  . EKG 12-Lead    ASSESSMENT AND PLAN:   Principal Problem:   Failure to thrive in adult Active Problems:   Hyperlipidemia   Essential  hypertension, benign   Coronary artery disease   Dysphagia   CKD (chronic kidney disease) stage 4, GFR 15-29 ml/min   Problem #1 failure to thrive due to dysphagia: Patient reports  choking coughing and having to reposition each time he swallows. Speech therapy has seen him and has recommended that he be nothing by mouth. There is concern that he may have protruding from behind the esophagus impairing his swallow. GI and ENT consultations have been requested. He has seen palliative care this morning. The patient is fairly clear that he does not wish to have a PEG tube. However in this case it would be appropriate given that he has a good quality of life and is cognitively intact. The decision rests with the patient. His son would like to have further evaluation in order for the patient to make a fully informed decision  Problem #2 hypertension: The pressure controlled. Continue with carvedilol  #3: Coronary artery disease: No chest pain at this time. Continue with Lipitor and carvedilol  #4 chronic kidney disease stage IV : GFR is actually 30 at this time which would be stage III. Electrolytes are stable  #5 anemia of chronic disease: Due to renal disease. Stable no signs of bleeding noted  #6: Disposition the patient is a DO NOT RESUSCITATE. He currently lives at home. His son lives across the street. [place] at this time the decision is for either a PEG tube placement or accepting the risk of aspiration and difficulty with swallowing  # 7 chronic lung disease: Seen on chest x-ray: This may be due to chronic aspiration. He has never been a smoker. He is not on oxygen currently  #8 chronic systolic congestive heart failure with an ejection fraction of 35-40% due to ischemic cardiomyopathy. No current exacerbation  All the records are reviewed and case discussed with Care Management/Social Workerr. Management plans discussed with the patient, family and they are in agreement.  CODE STATUS: DO NOT RESUSCITATE  TOTAL TIME TAKING CARE OF THIS PATIENT: 30 minutes.      Myrtis Ser M.D on 03/29/2015 at 2:24 PM  Between 7am to 6pm - Pager - [phone]  After 6pm go to  [email or web address] - password [email or web address] [facility]  Office  [phone]  CC: Primary care physician; Cletis Athens, MD

## 2015-03-29 NOTE — Evaluation (Signed)
Clinical/Bedside Swallow Evaluation Patient Details  Name: Jonathan Harrell MRN: [id number] Date of Birth: 1918/03/10  Today's Date: 03/29/2015 Time: SLP Start Time (ACUTE ONLY): 63 SLP Stop Time (ACUTE ONLY): 1230 SLP Time Calculation (min) (ACUTE ONLY): 60 min  Past Medical History:  Past Medical History  Diagnosis Date  . Anemia 07/14/2004  . Coronary artery disease     a. Cath in 2010->3VD->BMS to OM. CABG not felt to be option 2/2 age.  . Hyperlipemia 08/1999  . Hypertension 10/2002  . Osteoarthritis 09/2001  . History of echocardiogram 12/18/2003    Mild M. R.; MOD AI; MILD TR  . Syncope     a. Orthostatic in setting of volume depletion/diarrhea 04/2014.  Marland Kitchen History of CT scan of head 07/26/2005    Within normal limits/Carotid Ultrasound WNL 07/27/05  . Moderate mitral regurgitation   . Chronic kidney disease   . Moderate aortic regurgitation   . Ischemic cardiomyopathy     a. 04/2014 Echo: EF 35-40%, mod MR/AI.  Marland Kitchen Dysphagia    Past Surgical History:  Past Surgical History  Procedure Laterality Date  . Cholecystectomy  1980  . Insert / replace / remove pacemaker    . Cardiac catheterization  11/2008    [facility]  . Cardiac catheterization  12/2008    [facility]   HPI:  pt has had significant weight loss per report sec. to swallowing difficulty   Assessment / Plan / Recommendation Clinical Impression  Pt presents w/ severe-profound pharyngeal phase dysphagia apparent at bedside eval and is at HIGH risk for aspiration/declined respiratory status to occur. Pt appears to present w/ inability to safely transfer bolus material through the pharynx into the Esophagus, or fully into the Esophagus to remain in the Esophagus. Unsure if related to Esophageal/cricopharyngeus deficits or to any structural changes as pt does have arthritic changes in his neck w/ limited ROM(cannot look down and noted decreased hyolarygneal excursion during the swallow). With pt having difficulty clearing  small, single sips of thin liquids w/out coughing/expectorating and respiratory decline, no further po trials boluses/conssitencies were given at this time. Rec. further f/u w/ ENT/GI for consultation. Noted Palliative care following. ST gave thorough education on oral care to pt/family present and will f/u next 1-3 days w/ pt's status. NSG updated. MD consulted/agreed.    Aspiration Risk  Severe    Diet Recommendation NPO   Medication Administration: Other (Comment) (alternative route(IV))    Other  Recommendations Oral Care Recommendations: Oral care QID   Follow Up Recommendations   will monitor pt's status and provide any further assessment/education as indicated    Frequency and Duration  3-5x 1 week   Pertinent Vitals/Pain denied   SLP Swallow Goals     Swallow Study Prior Functional Status       General Date of Onset: 03/28/15 Other Pertinent Information: pt has had significant weight loss per report sec. to swallowing difficulty Type of Study: Bedside swallow evaluation Previous Swallow Assessment: pt has been seen by this service ~1 yr ago. at which time he and family reported pt had baseline swallowing difficulties in which he had to chew carefully and swallow carefully - food/liquids would not always go down completely. Pt had difficulty swallowing solids and large pills then - pt also had limitied ROM of his neck for looking down and prominent cartilage of the anterior neck. Pt requested to remain on an oral diet and was placed on a mech soft diet thin liquids w/ strict aspiration precautions; pt  had f/u w/ GI for abarium study at that time as well.  Diet Prior to this Study: Regular;Thin liquids;Other (Comment) (however, per report, pt has been unable to swallow anything w/out f/u coughing and expectoration of much/all of the food/liquid attempted. ) Temperature Spikes Noted: No Respiratory Status: Room air History of Recent Intubation: No Behavior/Cognition:  Alert;Cooperative;Pleasant mood Oral Cavity - Dentition: Missing dentition Self-Feeding Abilities: Able to feed self;Needs assist;Needs set up Patient Positioning: Upright in bed Baseline Vocal Quality: Wet;Low vocal intensity Volitional Cough: Strong Volitional Swallow: Able to elicit    Oral/Motor/Sensory Function Overall Oral Motor/Sensory Function: Appears within functional limits for tasks assessed   Ice Chips Ice chips: Not tested   Thin Liquid Thin Liquid: Impaired (x3/3 trials) Presentation: Cup;Self Fed Oral Phase Impairments:  (none noted) Pharyngeal  Phase Impairments: Decreased hyoid-laryngeal movement;Wet Vocal Quality;Cough - Immediate;Throat Clearing - Immediate;Other (comments);Change in Vital Signs (expectoration w/ increased phelgm expectorated - consistent w/ all trials) Other Comments:  (declined respiration status post each trial attempted(x3/3))    Nectar Thick Nectar Thick Liquid: Not tested   Honey Thick Honey Thick Liquid: Not tested   Puree Puree: Not tested   Solid   GO Functional Assessment Tool Used: bedside swallow eval Functional Limitations: Swallowing Swallow Current Status (M3817): 100 percent impaired, limited or restricted Swallow Goal Status (R1165): 100 percent impaired, limited or restricted  Solid: Not tested       [facility] 03/29/2015,2:24 PM

## 2015-03-29 NOTE — Consult Note (Signed)
  Pt seen and examined. Please see D. Martin's notes. Pt with significant dysphagia. In talking to pt and family, he has dysphagia to both solids and liquids with wt loss. Choking and coughing on foods. Could not tolerate barium swallow in the past due to risk of aspiration. Seen by speech path today. With limited range of motion of his neck, difficult for pt to get in proper neck position to swallow. Pt absolutely refuses G tube placement.  Imp: Based on his history, pt likely has oropharyngeal dysphagia, which is not amenable to EGD with dilation. Higher risk for EGD in pt due to advanced age.  Plan: Discussed at length with family. Do not feel EGD will help. Could arrange outpatient gastrografin swallow study to reduce risk of aspiration complications, though if swallow study confirms swallow mechanism problems, then we are limited as to what we can offer for pt other than follow dietary recommendations as per speech therapy. Will sign off. Family to decide long term options. Thanks.

## 2015-03-29 NOTE — Progress Notes (Signed)
Speech evaluation being done by Belenda Cruise from speech, Medications were not being taken per SLP order, pt was choking on sips of water. Son at bedside observing speech evaul. Palliative care and GI consult ordered too.

## 2015-03-29 NOTE — Consult Note (Signed)
Palliative Medicine Inpatient Consult Note   Name: Jonathan Harrell Date: 03/29/2015 MRN: [id number]  DOB: 1918-01-14  Referring Physician: Aldean Jewett, MD  Palliative Care consult requested for this 81 y.o. male for goals of medical therapy in patient with CAD, CKD, dysphagia admitted with FTT  Jonathan Harrell is a 81 yo man with PMH of CAD (not a candidate for CABG), HTN, hyperlipidemia, OA, CKD III, ICM with EF 35-40%, PPM, chronic lung dz (by CXR), s/p cholecystetomy. He was admitted 03/28/15 with worsening dysphagia and resultant decrease po intake and wt loss. Work-up ongoing. At present, pt is sitting up in bed. Denies pain, shortness of breath. Son at bedside.   REVIEW OF SYSTEMS:  Pain: None Dyspnea:  No Nausea/Vomiting:  No Diarrhea:  No Constipation:   No Depression:   No Anxiety:   No Fatigue:   Yes  SOCIAL HISTORY: Pt is widowed. He lives at home alone. Son lives across the street and checks on pt frequently. Pt also has a daughter in [place]. He graduated from [employer] with a major in Probation officer and a minor in chemistry and math. He worked Tax adviser.   reports that he has never smoked. He does not have any smokeless tobacco history on file. He reports that he does not drink alcohol or use illicit drugs.  LEGAL DOCUMENTS:   CODE STATUS: DNR  PAST MEDICAL HISTORY: Past Medical History  Diagnosis Date  . Anemia 07/14/2004  . Coronary artery disease     a. Cath in 2010->3VD->BMS to OM. CABG not felt to be option 2/2 age.  . Hyperlipemia 08/1999  . Hypertension 10/2002  . Osteoarthritis 09/2001  . History of echocardiogram 12/18/2003    Mild M. R.; MOD AI; MILD TR  . Syncope     a. Orthostatic in setting of volume depletion/diarrhea 04/2014.  Marland Kitchen History of CT scan of head 07/26/2005    Within normal limits/Carotid Ultrasound WNL 07/27/05  . Moderate mitral regurgitation   . Chronic kidney disease   . Moderate aortic regurgitation   . Ischemic  cardiomyopathy     a. 04/2014 Echo: EF 35-40%, mod Jonathan/AI.  Marland Kitchen Dysphagia     PAST SURGICAL HISTORY:  Past Surgical History  Procedure Laterality Date  . Cholecystectomy  1980  . Insert / replace / remove pacemaker    . Cardiac [facility]ization  11/2008    [facility]  . Cardiac [facility]ization  12/2008    [facility]    ALLERGIES:  is allergic to cortisone and lactose.  MEDICATIONS:  Current Facility-Administered Medications  Medication Dose Route Frequency Provider Last Rate Last Dose  . 0.9 %  sodium chloride infusion   Intravenous Continuous Lance Coon, MD 50 mL/hr at 03/28/15 2343    . acetaminophen (TYLENOL) tablet 650 mg  650 mg Oral Q6H PRN Lance Coon, MD       Or  . acetaminophen (TYLENOL) suppository 650 mg  650 mg Rectal Q6H PRN Lance Coon, MD      . atorvastatin (LIPITOR) tablet 20 mg  20 mg Oral Daily Lance Coon, MD      . carvedilol (COREG) tablet 6.25 mg  6.25 mg Oral BID Lance Coon, MD      . ferrous sulfate tablet 325 mg  325 mg Oral Daily Lance Coon, MD      . heparin injection 5,000 Units  5,000 Units Subcutaneous 3 times per day Lance Coon, MD   5,000 Units at 03/29/15 0005  . senna-docusate (Senokot-S) tablet  1 tablet  1 tablet Oral QHS PRN Lance Coon, MD        Vital Signs: BP 135/47 mmHg  Pulse 70  Temp(Src) 97.8 F (36.6 C) (Oral)  Resp 17  Ht 5\' 11"  (1.803 m)  Wt 49.125 kg (108 lb 4.8 oz)  BMI 15.11 kg/m2  SpO2 97% Filed Weights   03/28/15 1507 03/28/15 2326  Weight: 50.349 kg (111 lb) 49.125 kg (108 lb 4.8 oz)    Estimated body mass index is 15.11 kg/(m^2) as calculated from the following:   Height as of this encounter: 5\' 11"  (1.803 m).   Weight as of this encounter: 49.125 kg (108 lb 4.8 oz).  PERFORMANCE STATUS (ECOG) : 3 - Symptomatic, >50% confined to bed  PHYSICAL EXAM:  General: cachectic HEENT: OP clear, hearing intact Neck: Trachea midline  Cardiovascular: regular rate and rhythm  Pulmonary/Chest: fair air movement ant fields  without audible wheeze Abdomin: Soft, NTTP, + bowel sounds GU: No SP tenderness Extremities: No edema  Neurological: Grossly nonfocal Skin: no rashes Psychiatric: A&O x 3   LABS: CBC:  Recent Labs Lab 03/28/15 1457 03/29/15 0121  WBC 8.8 8.0  HGB 9.1* 8.0*  HCT 28.5* 24.7*  PLT 180 155   Comprehensive Metabolic Panel:  Recent Labs Lab 03/28/15 1457 03/29/15 0121 03/29/15 0122  NA 139  --  138  K 4.4  --  4.5  CL 108  --  109  CO2 25  --  23  GLUCOSE 92  --  78  BUN 37*  --  37*  CREATININE 1.86* 1.82* 1.83*  CALCIUM 8.7*  --  8.2*  MG  --  1.7  --     IMPRESSION: Jonathan Harrell is a 81 yo man with PMH of CAD (not a candidate for CABG), HTN, hyperlipidemia, OA, CKD III, ICM with EF 35-40%, PPM, chronic lung dz (by CXR), s/p cholecystetomy. He was admitted 03/28/15 with worsening dysphagia and resultant decrease po intake and wt loss. Work-up ongoing.   I spoke with pt and his son. Pt says that he wants to go home today but son wants work-up for dysphagia. Son describes chronic dysphagia and chart review shows BaS in 04/2014 that was not completed due to risk of aspiration. Son says that pt has refused PEG in the past.   Review of CXR's shows progression of severe chronic lung disease though neither pt nor son are aware of any pulmonary problem. Son says that pt has difficulty walking even short distances due to shortness of breath but son assumed this was because pt was weak from not eating.   Will follow up after GI and ST see pt.    PLAN: As above   More than 50% of the visit was spent in counseling/coordination of care: YES  Time spent: 75 minutes

## 2015-03-29 NOTE — Plan of Care (Signed)
Problem: Discharge Progression Outcomes Goal: Discharge plan in place and appropriate Individualization Pt prefers to be called Jonathan Harrell.  Hx anemia, hyperlipdemia, HTN, osteoarthritis controlled by home medication  Pt is a high fall risk and shows understanding of how to use call system for assistance     Goal: Tolerating diet Outcome: Not Progressing Pt to have swallow evaluation today Goal: Other Discharge Outcomes/Goals Plan of care progress to goals: -pt has not had anything to eat/drink during shift per pt preference -pt has been resting throughout shift; no c/o pain  -VS stable  -no fall/injury this shift

## 2015-03-29 NOTE — Plan of Care (Signed)
Problem: Discharge Progression Outcomes Goal: Other Discharge Outcomes/Goals Outcome: Progressing Pt and family in room, speech evaluation done and pt could not swallow water. Pt NPO right now. Awaiting GI and ENT to see pt.

## 2015-03-29 NOTE — Progress Notes (Signed)
On call MD made aware Hgb drop from 9.1 to 8.0 Stated OK to give Heparin injection since vitals are stable and no active bleeding but to check with morning doctors before giving.  Hiram Gash [employer]

## 2015-03-29 NOTE — Progress Notes (Deleted)
On call MD made aware Hgb drop from 9.1 to 8.0.  Stated OK to give Heparin injection since vitals are stable and no active bleeding evident.  Hiram Gash [employer]

## 2015-03-29 NOTE — Consult Note (Addendum)
GI Inpatient Consult Note  Reason for Consult: Dysphagia   Attending Requesting Consult:  Lance Coon, MD History of Present Illness: Jonathan Harrell is a 81 y.o. male who has been admitted with progressive dysphagia.  His dysphagia has worsens over the years to the point of not being able to swallow his own salvia. He is coughing with solids, mush and even the Nectar thick liquids per his daughter -in law.  She has noticed him getting weaker and thinner over the last 2-3 weeks.  She is concerned about him continuing to loose weight and getting dehydrated because of him not being able to swallow anything without coughing.  Jonathan Harrell and his son voice wanting to know what is causing his dysphagia.  Admits to not wanting PEG placement but open to an EGD if needed to find out what is wrong.    Past Medical History:  Past Medical History  Diagnosis Date  . Anemia 07/14/2004  . Coronary artery disease     a. Cath in 2010->3VD->BMS to OM. CABG not felt to be option 2/2 age.  . Hyperlipemia 08/1999  . Hypertension 10/2002  . Osteoarthritis 09/2001  . History of echocardiogram 12/18/2003    Mild M. R.; MOD AI; MILD TR  . Syncope     a. Orthostatic in setting of volume depletion/diarrhea 04/2014.  Marland Kitchen History of CT scan of head 07/26/2005    Within normal limits/Carotid Ultrasound WNL 07/27/05  . Moderate mitral regurgitation   . Chronic kidney disease   . Moderate aortic regurgitation   . Ischemic cardiomyopathy     a. 04/2014 Echo: EF 35-40%, mod MR/AI.  Marland Kitchen Dysphagia     Problem List: Patient Active Problem List   Diagnosis Date Noted  . Dysphagia 03/28/2015  . Failure to thrive in adult 03/28/2015  . CKD (chronic kidney disease) stage 4, GFR 15-29 ml/min 03/28/2015  . Ischemic cardiomyopathy   . Syncope   . Moderate mitral regurgitation   . Moderate aortic regurgitation   . Coronary artery disease   . VITAMIN D DEFICIENCY 03/20/2010  . DIARRHEA 03/06/2010  . NEVI, MULTIPLE  10/24/2008  . Hyperlipidemia 10/24/2008  . ANEMIA-NOS 10/24/2008  . Essential hypertension, benign 10/24/2008  . RBBB 10/24/2008  . MICROSCOPIC HEMATURIA 10/24/2008  . OSTEOARTHRITIS 10/24/2008  . SYNCOPE, HX OF 10/24/2008    Past Surgical History: Past Surgical History  Procedure Laterality Date  . Cholecystectomy  1980  . Insert / replace / remove pacemaker    . [facility]iac [facility]  [facility]    [facility]  . [facility]iac [facility]  12/2008    [facility]    Allergies: Allergies  Allergen Reactions  . Cortisone Other (See Comments)    Reaction:  Unknown   . Lactose Other (See Comments)    Pt is lactose intolerant.      Home Medications: Prescriptions prior to admission  Medication Sig Dispense Refill Last Dose  . atorvastatin (LIPITOR) 20 MG tablet Take 20 mg by mouth daily.   unknown at unknown  . carvedilol (COREG) 6.25 MG tablet Take 6.25 mg by mouth 2 (two) times daily.    unknown at unknown  . ferrous sulfate 325 (65 FE) MG tablet Take 325 mg by mouth daily.    unknown at unknown  . Multiple Vitamins-Minerals (MULTIVITAMIN PO) Take 1 each by mouth daily.   unknown at unknown   Home medication reconciliation was completed with the patient.   Scheduled Inpatient Medications:   . atorvastatin  20 mg Oral  Daily  . carvedilol  6.25 mg Oral BID  . ferrous sulfate  325 mg Oral Daily  . heparin  5,000 Units Subcutaneous 3 times per day    Continuous Inpatient Infusions:   . sodium chloride 50 mL/hr at 03/28/15 2343    PRN Inpatient Medications:  acetaminophen OR acetaminophen, senna-docusate  Family History: family history includes Cancer in his father; Hypertension in his mother; Stroke in his mother.  The patient's family history is negative for inflammatory bowel disorders, GI malignancy, or solid organ transplantation.  Social History:   reports that he has never smoked. He does not have any smokeless tobacco history on file. He reports that he does not drink alcohol  or use illicit drugs. The patient denies ETOH, tobacco, or drug use.   Review of Systems: Constitutional: Weight loss, malaise Eyes: No changes in vision. ENT: No oral lesions, sore throat.  GI: see HPI.  Heme/Lymph: No easy bruising.  CV: No chest pain.  GU: No hematuria.  Integumentary: No rashes.  Neuro: No headaches.  Psych: No depression/anxiety.  Endocrine: No heat/cold intolerance.  Allergic/Immunologic: No urticaria.  Resp: Coughing with liquids and solids. No SOB.  Musculoskeletal: No joint swelling.    Physical Examination: BP 131/48 mmHg  Pulse 73  Temp(Src) 98.1 F (36.7 C) (Oral)  Resp 17  Ht 5\' 11"  (1.803 m)  Wt 49.125 kg (108 lb 4.8 oz)  BMI 15.11 kg/m2  SpO2 96% Gen: NAD, alert and oriented x 3.  Thin and frail.  HEENT: Normocephalic. Atraumatic. Sclera anicteric.  Neck: supple, no JVD or thyromegaly Chest: CTA bilaterally, no wheezes, crackles, or other adventitious sounds CV: RRR, no m/g/c/r Abd: soft, NT, ND, +BS in all four quadrants; no HSM, guarding, ridigity, or rebound tenderness Ext: no edema, well perfused with 2+ pulses, Skin: no rash or lesions noted Lymph: no LAD  Data: Lab Results  Component Value Date   WBC 8.0 03/29/2015   HGB 8.0* 03/29/2015   HCT 24.7* 03/29/2015   MCV 94.9 03/29/2015   PLT 155 03/29/2015    Recent Labs Lab 03/27/15 0050 03/28/15 1457 03/29/15 0121  HGB 9.0* 9.1* 8.0*   Lab Results  Component Value Date   NA 138 03/29/2015   K 4.5 03/29/2015   CL 109 03/29/2015   CO2 23 03/29/2015   BUN 37* 03/29/2015   CREATININE 1.83* 03/29/2015   Lab Results  Component Value Date   ALT 40 11/17/2014   AST 52* 11/17/2014   ALKPHOS 82 11/17/2014   BILITOT 0.8 03/18/2010   Assessment/Plan: Jonathan Harrell is a 81 y.o. male with Progressive Dysphagia and Failure To Thrive. Evaluated by speech therapy. Recommendations not available at time of consult.  Barium Swallow not completed in 04/2014 because of concern for  aspiration. Dysphagia    Recommendations:  Barium Swallow or possible EGD to evaluate Dysphagia.  Dr. Candace Cruise is to discuss with patient's son with recommendation to follow.  Thank you for the consult. Please call with questions or concerns. Patient seen with Dr. Verdie Shire under collaborative agreement.  Faye Ramsay, FNP

## 2015-03-29 NOTE — Discharge Instructions (Signed)
°  DIET:  You are at high risk for aspiration as discussed during your stay here.  Follow speech therapy recommendations for aspiration precautions. Consider adding a supplement such as Ensure or Boost to increase calories and nutrition.  DISCHARGE CONDITION:  Fair  ACTIVITY:  As tolerated  OXYGEN:  Not needed   DISCHARGE LOCATION:  To home. Hospice Representative will contact you tomorrow.  If you experience worsening of your admission symptoms, develop shortness of breath, life threatening emergency, suicidal or homicidal thoughts you must seek medical attention immediately by calling 911 or calling your MD immediately  if symptoms less severe.  You Must read complete instructions/literature along with all the possible adverse reactions/side effects for all the Medicines you take and that have been prescribed to you. Take any new Medicines after you have completely understood and accpet all the possible adverse reactions/side effects.   Please note  You were cared for by a hospitalist during your hospital stay. If you have any questions about your discharge medications or the care you received while you were in the hospital after you are discharged, you can call the unit and asked to speak with the hospitalist on call if the hospitalist that took care of you is not available. Once you are discharged, your primary care physician will handle any further medical issues. Please note that NO REFILLS for any discharge medications will be authorized once you are discharged, as it is imperative that you return to your primary care physician (or establish a relationship with a primary care physician if you do not have one) for your aftercare needs so that they can reassess your need for medications and monitor your lab values.

## 2015-03-29 NOTE — Care Management (Signed)
Admitted to [facility] with the diagnosis of failure to thrive. Lives alone. Son is Nadara Mustard,  [phone]). [facility] in the past. Uses a cane to aide in ambulation. No skilled facility. No home oxygen. Sees Dr. Lavera Guise. Last seen 2 days ago. Life Alert and shower chair in the home. Takes care of basic needs himself. Family will transport Shelbie Ammons RN MSN Care Management [phone]

## 2015-03-29 NOTE — Progress Notes (Signed)
Initial Nutrition Assessment  DOCUMENTATION CODES:  Severe malnutrition in context of chronic illness   NUTRITION DIAGNOSIS:  Inadequate oral intake related to inability to eat as evidenced by NPO status.  GOAL:  Patient will meet greater than or equal to 90% of their needs  MONITOR:  Energy Intake: will follow poc Anthropometrics Digestive System Electrolyte and renal Profile  REASON FOR ASSESSMENT:  Malnutrition Screening Tool    ASSESSMENT:  Pt admitted with FTT secondary to dysphagia s/p SLP evaluation; currently NPO.  GI and ENT consulted. PMHx:  Past Medical History  Diagnosis Date  . Anemia 07/14/2004  . Coronary artery disease     a. Cath in 2010->3VD->BMS to OM. CABG not felt to be option 2/2 age.  . Hyperlipemia 08/1999  . Hypertension 10/2002  . Osteoarthritis 09/2001  . History of echocardiogram 12/18/2003    Mild M. R.; MOD AI; MILD TR  . Syncope     a. Orthostatic in setting of volume depletion/diarrhea 04/2014.  Marland Kitchen History of CT scan of head 07/26/2005    Within normal limits/Carotid Ultrasound WNL 07/27/05  . Moderate mitral regurgitation   . Chronic kidney disease   . Moderate aortic regurgitation   . Ischemic cardiomyopathy     a. 04/2014 Echo: EF 35-40%, mod MR/AI.  Marland Kitchen Dysphagia    PO Intake: pt tells Probation officer he eats cereal every morning and a sandwich or 2 every lunch with deli meat and for dinner he cannot remember.  Pt vague about intake of said meals. Writer notes dysphagia history.  Medications: NS at 56mL/hr, ferrous sulfate Labs: Electrolyte and Renal Profile:    Recent Labs Lab 03/27/15 0050 03/28/15 1457 03/29/15 0121 03/29/15 0122  BUN 42* 37*  --  37*  CREATININE 1.86* 1.86* 1.82* 1.83*  NA 139 139  --  138  K 4.3 4.4  --  4.5  MG  --   --  1.7  --   PHOS  --   --  3.4  --    Glucose Profile: No results for input(s): GLUCAP in the last 72 hours. Protein Profile: No results for input(s): ALBUMIN in the last 168  hours.  Pt reports weight loss. Pt reports likely 10lbs in the past year but is unsure.  Per MST pt weight loss  >34lbs weight loss PTA. Noted in CHL weight of 132 04/24/2014 (18% weight loss in 11 months)  Height:  Ht Readings from Last 1 Encounters:  03/28/15 5\' 11"  (1.803 m)    Weight:  Wt Readings from Last 1 Encounters:  03/28/15 108 lb 4.8 oz (49.125 kg)    Ideal Body Weight:  78kg  Wt Readings from Last 10 Encounters:  03/28/15 108 lb 4.8 oz (49.125 kg)  03/26/15 111 lb (50.349 kg)  04/24/14 132 lb (59.875 kg)  10/30/10 157 lb 4 oz (71.328 kg)  10/08/10 156 lb 12 oz (71.101 kg)  04/23/10 152 lb 12 oz (69.287 kg)  03/20/10 160 lb 12 oz (72.916 kg)  03/06/10 159 lb 8 oz (72.349 kg)  10/24/08 160 lb (72.576 kg)    BMI:  Body mass index is 15.11 kg/(m^2).   Nutrition-Focused physical exam completed. Findings are severe fat depletion, severe muscle depletion, and no edema.   Estimated Nutritional Needs:  Kcal:  1897-2242kcals, BEE: 1437kcals, TEE: (IF 1.1-1.3)(AF 1.2) using IBW of 78kg  Protein:  78-94g protein (1.0-1.2g/kg) using IBW of 78kg  Fluid:  1950-2361mL of fluid (25-101mL/kg) using IBW of 78kg  Diet Order:  Diet  NPO time specified  EDUCATION NEEDS:  No education needs identified at this time   Intake/Output Summary (Last 24 hours) at 03/29/15 1525 Last data filed at 03/29/15 0800  Gross per 24 hour  Intake      0 ml  Output    150 ml  Net   -150 ml    Last BM:  5/12  HIGH Care Level  Dwyane Luo, RD, LDN Pager [phone]

## 2015-03-29 NOTE — Consult Note (Signed)
Jonathan Harrell, Jonathan Harrell [id number] [birth year] Aldean Jewett, MD  Reason for Consult: Dysphagia  HPI: Many year history of dysphagia has gotten worse.  Unable to do Ba swallow due to aspiration.  Now admitted for failure to thrive due to same.  Refusing Gtube.  No pain, no hemoptysis.    Allergies:  Allergies  Allergen Reactions  . Cortisone Other (See Comments)    Reaction:  Unknown   . Lactose Other (See Comments)    Pt is lactose intolerant.      ROS: Review of systems normal other than 12 systems except per HPI.  PMH:  Past Medical History  Diagnosis Date  . Anemia 07/14/2004  . Coronary artery disease     a. Cath in 2010->3VD->BMS to OM. CABG not felt to be option 2/2 age.  . Hyperlipemia 08/1999  . Hypertension 10/2002  . Osteoarthritis 09/2001  . History of echocardiogram 12/18/2003    Mild M. R.; MOD AI; MILD TR  . Syncope     a. Orthostatic in setting of volume depletion/diarrhea 04/2014.  Marland Kitchen History of CT scan of head 07/26/2005    Within normal limits/Carotid Ultrasound WNL 07/27/05  . Moderate mitral regurgitation   . Chronic kidney disease   . Moderate aortic regurgitation   . Ischemic cardiomyopathy     a. 04/2014 Echo: EF 35-40%, mod MR/AI.  Marland Kitchen Dysphagia     FH:  Family History  Problem Relation Age of Onset  . Hypertension Mother   . Stroke Mother   . Cancer Father     prostate    SH:  History   Social History  . Marital Status: Widowed    Spouse Name: N/A  . Number of Children: 3  . Years of Education: N/A   Occupational History  . Retired [employer]    Social History Main Topics  . Smoking status: Never Smoker   . Smokeless tobacco: Not on file  . Alcohol Use: No  . Drug Use: No  . Sexual Activity: Not on file   Other Topics Concern  . Not on file   Social History Narrative   Married and lives with wife    PSH:  Past Surgical History  Procedure Laterality Date  . Cholecystectomy  1980  . Insert / replace / remove  pacemaker    . Cardiac catheterization  11/2008    [facility]  . Cardiac catheterization  12/2008    [facility]    Physical  Exam: CN 2-12 grossly intact and symmetric. EAC/TMs normal BL. Oral cavity, lips, gums, ororpharynx normal with no masses or lesions. Skin warm and dry. Nasal cavity without polyps or purulence. External nose and ears without masses or lesions. EOMI, PERRLA. Neck supple with no masses or lesions. No lymphadenopathy palpated. Thyroid normal with no masses.  Laryngoscopy performed at bedside with flexible scope.  Large osteophyte post pharyngeal wall prohibiting some epiglottic movement.  Vocal cord mobility normal.  Obvious aspiration of saliva with pooling of secretions in both pyriform sinus regions.  Multiple attempts to get him to clear these secretions failed.  No tumor seen, but with this pooling of secretions difficult to fully assess the pyriform regions.     A/P: Dysphagia-I think multifactorial with osteophyte, pooling of secretions, possible esophageal stricture.  Obvious that he's not protecting his airway normally which could be neurologic as well.  [place]  Spoke with patient at length.  He is totally aware of issues, but does not  want to proceed with any intervention.  I don't believe the osteophyte is the main issue, and I don't believe at his age surgery is an option.  He would like to try as best he can to swallow, but he understands it could lead to pneumonia or starving.  "I'm ready to go to heaven" he said several times.  Will sign off, feel free to contact me for further questions.     Doy Taaffe T 03/29/2015 5:19 PM

## 2015-03-30 NOTE — Progress Notes (Signed)
Received a phone call from Jonathan Harrell at [name] stating that she had received a call from Jonathan Jonathan Harrell's son requesting Hospice in his home services. Jonathan Harrell stated that she had received no information from [name] about this referral. Jonathan Harrell was discharged from [name] on 03/29/15. Copies of all discharge documentation was faxed to [name] at [name] of [facility].

## 2015-04-06 NOTE — Discharge Summary (Signed)
[place] [facility] [place]   PATIENT NAME: Jonathan Harrell    MR#:  [id number]  DATE OF BIRTH:  06/16/1918  DATE OF ADMISSION:  03/28/2015 ADMITTING PHYSICIAN: Lance Coon, MD  DATE OF DISCHARGE: 03/29/2015  7:24 PM  PRIMARY CARE PHYSICIAN: MASOUD,JAVED, MD    ADMISSION DIAGNOSIS:  Dysphagia [R13.10]  DISCHARGE DIAGNOSIS:  Principal Problem:   Failure to thrive in adult Active Problems:   Hyperlipidemia   Essential hypertension, benign   Coronary artery disease   Dysphagia   CKD (chronic kidney disease) stage 4, GFR 15-29 ml/min   Protein-calorie malnutrition, severe   SECONDARY DIAGNOSIS:   Past Medical History  Diagnosis Date  . Anemia 07/14/2004  . Coronary artery disease     a. Cath in 2010->3VD->BMS to OM. CABG not felt to be option 2/2 age.  . Hyperlipemia 08/1999  . Hypertension 10/2002  . Osteoarthritis 09/2001  . History of echocardiogram 12/18/2003    Mild M. R.; MOD AI; MILD TR  . Syncope     a. Orthostatic in setting of volume depletion/diarrhea 04/2014.  Marland Kitchen History of CT scan of head 07/26/2005    Within normal limits/Carotid Ultrasound WNL 07/27/05  . Moderate mitral regurgitation   . Chronic kidney disease   . Moderate aortic regurgitation   . Ischemic cardiomyopathy     a. 04/2014 Echo: EF 35-40%, mod MR/AI.  Marland Kitchen Dysphagia     HOSPITAL COURSE:   Problem #1 failure to thrive due to dysphagia: Patient reports choking coughing and having to reposition each time he swallows. Speech therapy has seen him and has recommended that he be nothing by mouth due to aspiration with any attempted swallowing including water. He has now been seen by palliative care, gastroenterology and ENT. It is clear that he has significant aspiration with each swallow. It is not felt that EGD or other surgical procedure would be beneficial. He is clear and his wish not to have a PEG tube placed. He would like to be discharged  today to home with hospice following. He is alert oriented cognitively intact and making this decision full understanding of the ramifications.  Problem #2 hypertension: The pressure controlled to out hospitalization on carvedilol  #3: Coronary artery disease: Continue with Lipitor and carvedilol  #4 chronic kidney disease stage IV : GFR is actually 30 at this time which would be stage III. Electrolytes are stable  #5 anemia of chronic disease: Due to renal disease. Stable no signs of bleeding noted  #6: Disposition the patient is a DO NOT RESUSCITATE. He currently lives at home. His son lives across the street. He will be followed by hospice in outpatient setting  # 7 chronic lung disease: Seen on chest x-ray: This may be due to chronic aspiration. He has never been a smoker. He is not on oxygen currently. He was not aware of diagnosis of chronic lung disease prior to this admission.  #8 chronic systolic congestive heart failure with an ejection fraction of 35-40% due to ischemic cardiomyopathy. No current exacerbation  DISCHARGE CONDITIONS:   Hospice  CONSULTS OBTAINED:  Treatment Team:  Hulen Luster, MD Beverly Gust, MD  DRUG ALLERGIES:   Allergies  Allergen Reactions  . Cortisone Other (See Comments)    Reaction:  Unknown   . Lactose Other (See Comments)    Pt is lactose intolerant.      DISCHARGE MEDICATIONS:   Discharge Medication List as of 03/29/2015  6:36 PM  CONTINUE these medications which have NOT CHANGED   Details  atorvastatin (LIPITOR) 20 MG tablet Take 20 mg by mouth daily., Until Discontinued, Historical Med    carvedilol (COREG) 6.25 MG tablet Take 6.25 mg by mouth 2 (two) times daily. , Until Discontinued, Historical Med    ferrous sulfate 325 (65 FE) MG tablet Take 325 mg by mouth daily. , Until Discontinued, Historical Med    Multiple Vitamins-Minerals (MULTIVITAMIN PO) Take 1 each by mouth daily., Until Discontinued, Historical Med          DISCHARGE INSTRUCTIONS:    See AVS  If you experience worsening of your admission symptoms, develop shortness of breath, life threatening emergency, suicidal or homicidal thoughts you must seek medical attention immediately by calling 911 or calling your MD immediately  if symptoms less severe.  You Must read complete instructions/literature along with all the possible adverse reactions/side effects for all the Medicines you take and that have been prescribed to you. Take any new Medicines after you have completely understood and accept all the possible adverse reactions/side effects.   Please note  You were cared for by a hospitalist during your hospital stay. If you have any questions about your discharge medications or the care you received while you were in the hospital after you are discharged, you can call the unit and asked to speak with the hospitalist on call if the hospitalist that took care of you is not available. Once you are discharged, your primary care physician will handle any further medical issues. Please note that NO REFILLS for any discharge medications will be authorized once you are discharged, as it is imperative that you return to your primary care physician (or establish a relationship with a primary care physician if you do not have one) for your aftercare needs so that they can reassess your need for medications and monitor your lab values.    Today   CHIEF COMPLAINT:   Chief Complaint  Patient presents with  . Dysphagia    HISTORY OF PRESENT ILLNESS:  Jonathan Harrell is a 81 y.o. male who presents with failure to thrive due to progressive dysphagia and significantly decreased by mouth intake. Patient has had dysphagia for a number of years now, and it has gotten progressively worse to the point now that he has difficulty even swallowing his own saliva. He went to see his PCP Dr. Lavera Guise about this, and was told to come to the ED for admission and  evaluation by gastroenterology as his PCP was very concerned that he was extremely high risk for aspiration after watching him try to swallow small amount of ensure drink. Patient's son and daughter are in the room with him, and stated that he has had significant weight loss, decreased strength, and overall progressive lethargy, especially over the last several months. Patient is to the point now that he has difficulty swallowing solids and liquids, even very thin liquids. Patient is arousable and very pleasant though he is a very thin and frail looking elderly gentleman.  VITAL SIGNS:  Blood pressure 131/48, pulse 73, temperature 98.1 F (36.7 C), temperature source Oral, resp. rate 17, height 5\' 11"  (1.803 m), weight 49.125 kg (108 lb 4.8 oz), SpO2 96 %.  I/O:  No intake or output data in the 24 hours ending 04/06/15 1507  PHYSICAL EXAMINATION:  GENERAL: 81 y.o.-year-old patient lying in the bed with no acute distress. Thin EYES: Pupils equal, round, reactive to light and accommodation. No scleral icterus. Extraocular  muscles intact.  HEENT: Head atraumatic, normocephalic. Oropharynx and nasopharynx clear.  NECK: Supple, no jugular venous distention. No thyroid enlargement, no tenderness. Has a very large Adam's apple  LUNGS: Scattered crackles and rhonchi, no respiratory distress or difficulty breathing no use of accessory muscles  CARDIOVASCULAR: S1, S2 normal. No murmurs, rubs, or gallops.  ABDOMEN: Soft, nontender, nondistended. Bowel sounds present. No organomegaly or mass. Thin EXTREMITIES: No pedal edema, cyanosis, or clubbing.  NEUROLOGIC: Cranial nerves II through XII are intact. Muscle strength 5/5 in all extremities. Sensation intact. Gait not checked.  PSYCHIATRIC: The patient is alert and oriented x 3.  SKIN: No obvious rash, lesion, or ulcer.    DATA REVIEW:   CBC No results for input(s): WBC, HGB, HCT, PLT in the last 168 hours.  Chemistries  No results for  input(s): NA, K, CL, CO2, GLUCOSE, BUN, CREATININE, CALCIUM, MG, AST, ALT, ALKPHOS, BILITOT in the last 168 hours.  Invalid input(s): GFRCGP  Cardiac Enzymes No results for input(s): TROPONINI in the last 168 hours.  Microbiology Results  Results for orders placed or performed in visit on 11/17/14  Culture, blood (single)     Status: None   Collection Time: 11/17/14 12:02 PM  Result Value Ref Range Status   Micro Text Report   Final       COMMENT                   NO GROWTH AEROBICALLY/ANAEROBICALLY IN 5 DAYS   ANTIBIOTIC                                                      Influenza A,B,H1N1 - PCR [facility])     Status: None   Collection Time: 11/17/14 12:02 PM  Result Value Ref Range Status   Influenza A By PCR NEGATIVE NEGATIVE Final   Influenza B By PCR NEGATIVE NEGATIVE Final   H1N1 flu by pcr NOT DETECTED NOT-DETECTED Final    Comment:                  ----------------------- The Xpert Flu assay (FDA approval for nasal aspirates or washes and nasopharyngeal swab specimens), is intended as an aid in the diagnosis of influenza and should not be used as a sole basis for treatment.   Culture, blood (single)     Status: None   Collection Time: 11/17/14 12:07 PM  Result Value Ref Range Status   Micro Text Report   Final       COMMENT                   NO GROWTH AEROBICALLY/ANAEROBICALLY IN 5 DAYS   ANTIBIOTIC                                                      Clostridium Difficile [facility])     Status: None   Collection Time: 11/18/14 10:42 AM  Result Value Ref Range Status   Micro Text Report   Final       C.DIFFICILE ANTIGEN       C.DIFFICILE GDH ANTIGEN : NEGATIVE   C.DIFFICILE TOXIN A/B     C.DIFFICILE TOXINS A  AND B : NEGATIVE   INTERPRETATION            Negative for C. difficile.    ANTIBIOTIC                                                      Stool culture     Status: None   Collection Time: 11/18/14 10:57 AM  Result Value Ref Range Status   Micro Text  Report   Final       COMMENT                   NO SALMONELLA OR SHIGELLA ISOLATED   COMMENT                   NO PATHOGENIC E.COLI DETECTED   COMMENT                   NO CAMPYLOBACTER ANTIGEN DETECTED   ANTIBIOTIC                                                        RADIOLOGY:  No results found.  EKG:   Orders placed or performed in visit on 03/27/15  . EKG 12-Lead      Management plans discussed with the patient, family and they are in agreement.  CODE STATUS:  Advance Directive Documentation        Most Recent Value   Type of Advance Directive  Healthcare Power of Attorney   Pre-existing out of facility DNR order (yellow form or pink MOST form)     "MOST" Form in Place?        TOTAL TIME TAKING CARE OF THIS PATIENT: 40 minutes.    Myrtis Ser M.D on 04/06/2015 at 3:07 PM  Between 7am to 6pm - Pager - [phone]  After 6pm go to [email or web address] - password [email or web address] [facility]  Office  [phone]  CC: Primary care physician; Cletis Athens, MD

## 2015-04-17 DEATH — deceased

## 2016-02-26 IMAGING — CR DG CHEST 2V
1 series · 2 of 2 positions shown · non-contrast
Comparison: 11/17/2014

CLINICAL DATA: Inability to swallow today.  Weight loss.

EXAM:
CHEST  2 VIEW

[Series 1: dg chest 2 view · 0.14mm/px · 2 of 2 slices shown]
[im 1/2]
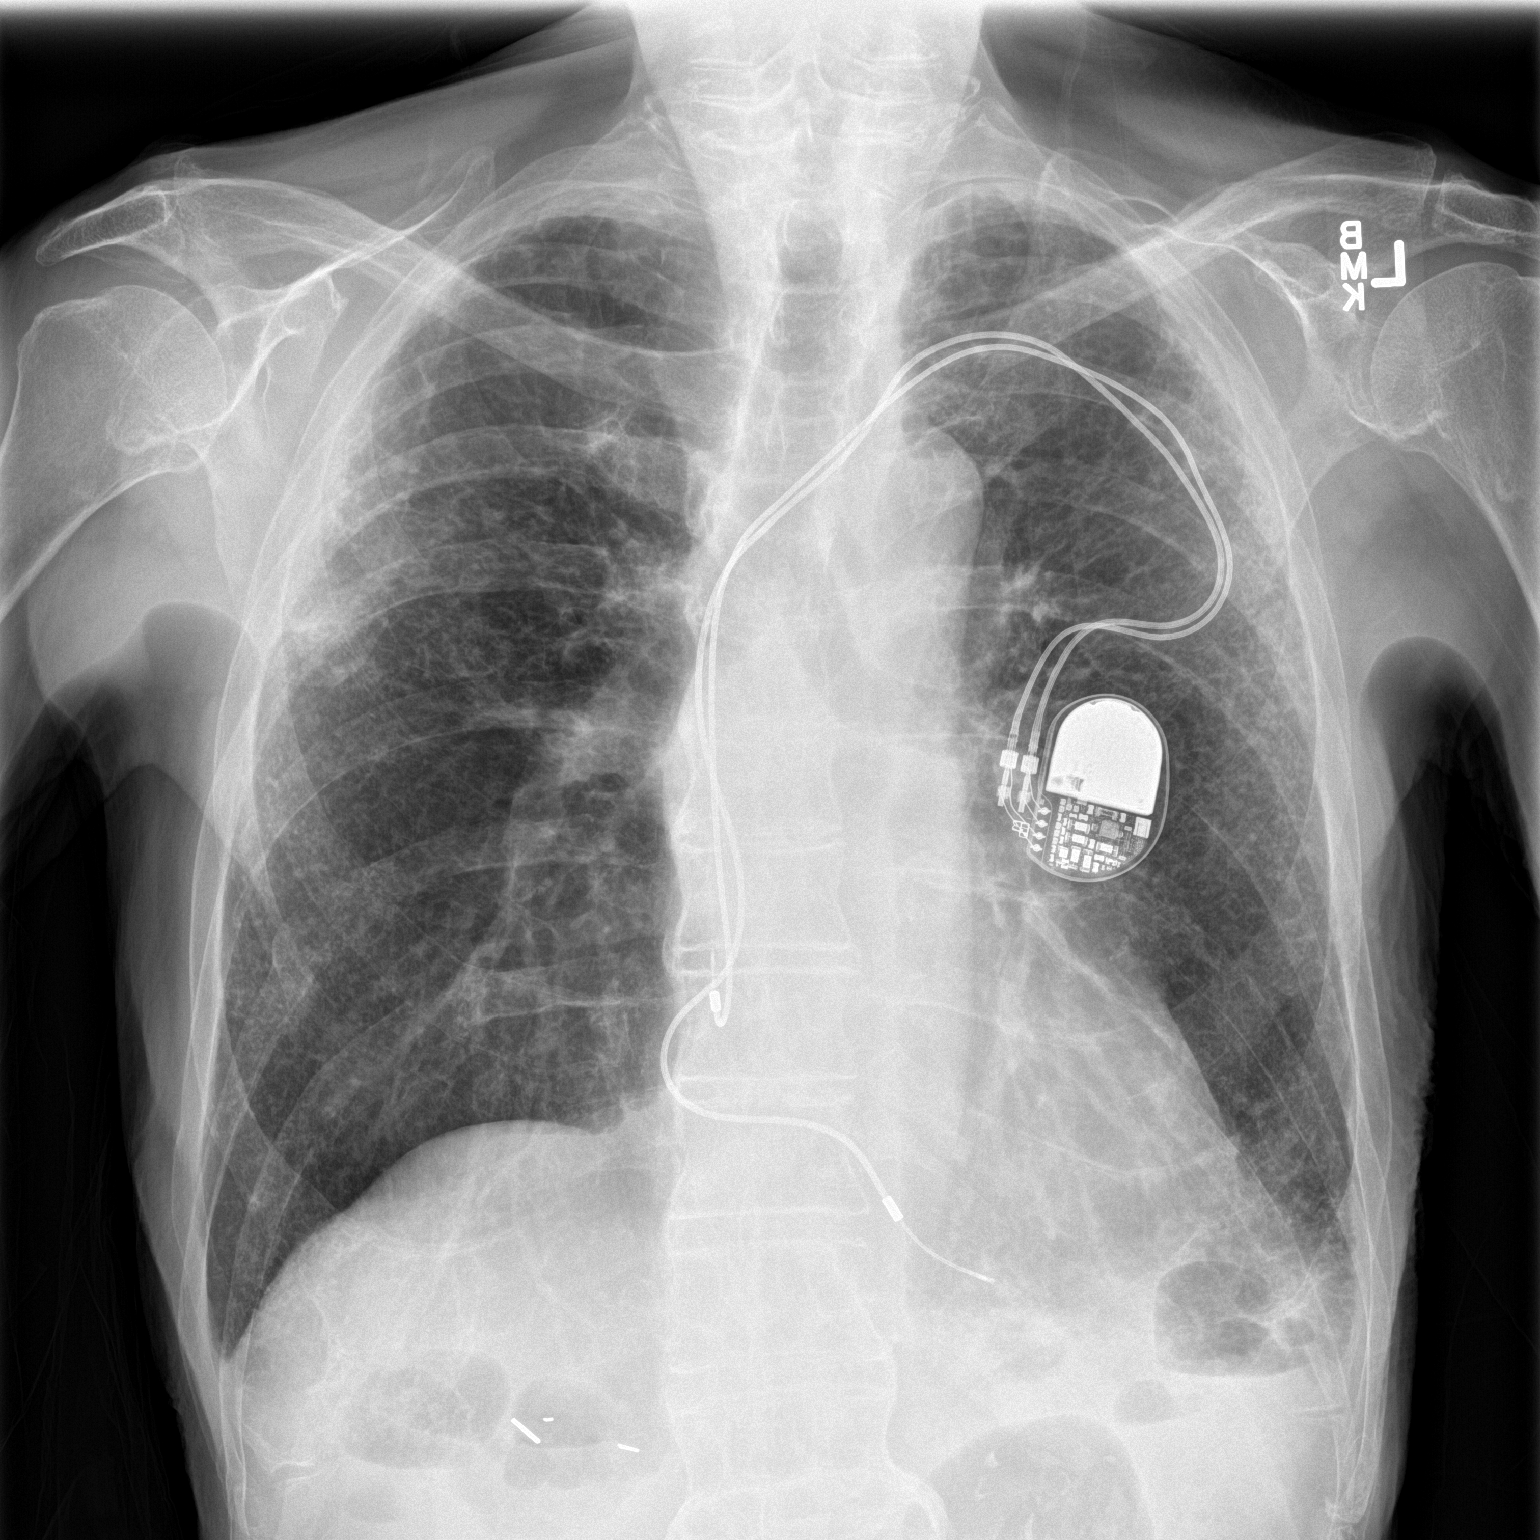
[im 2/2]
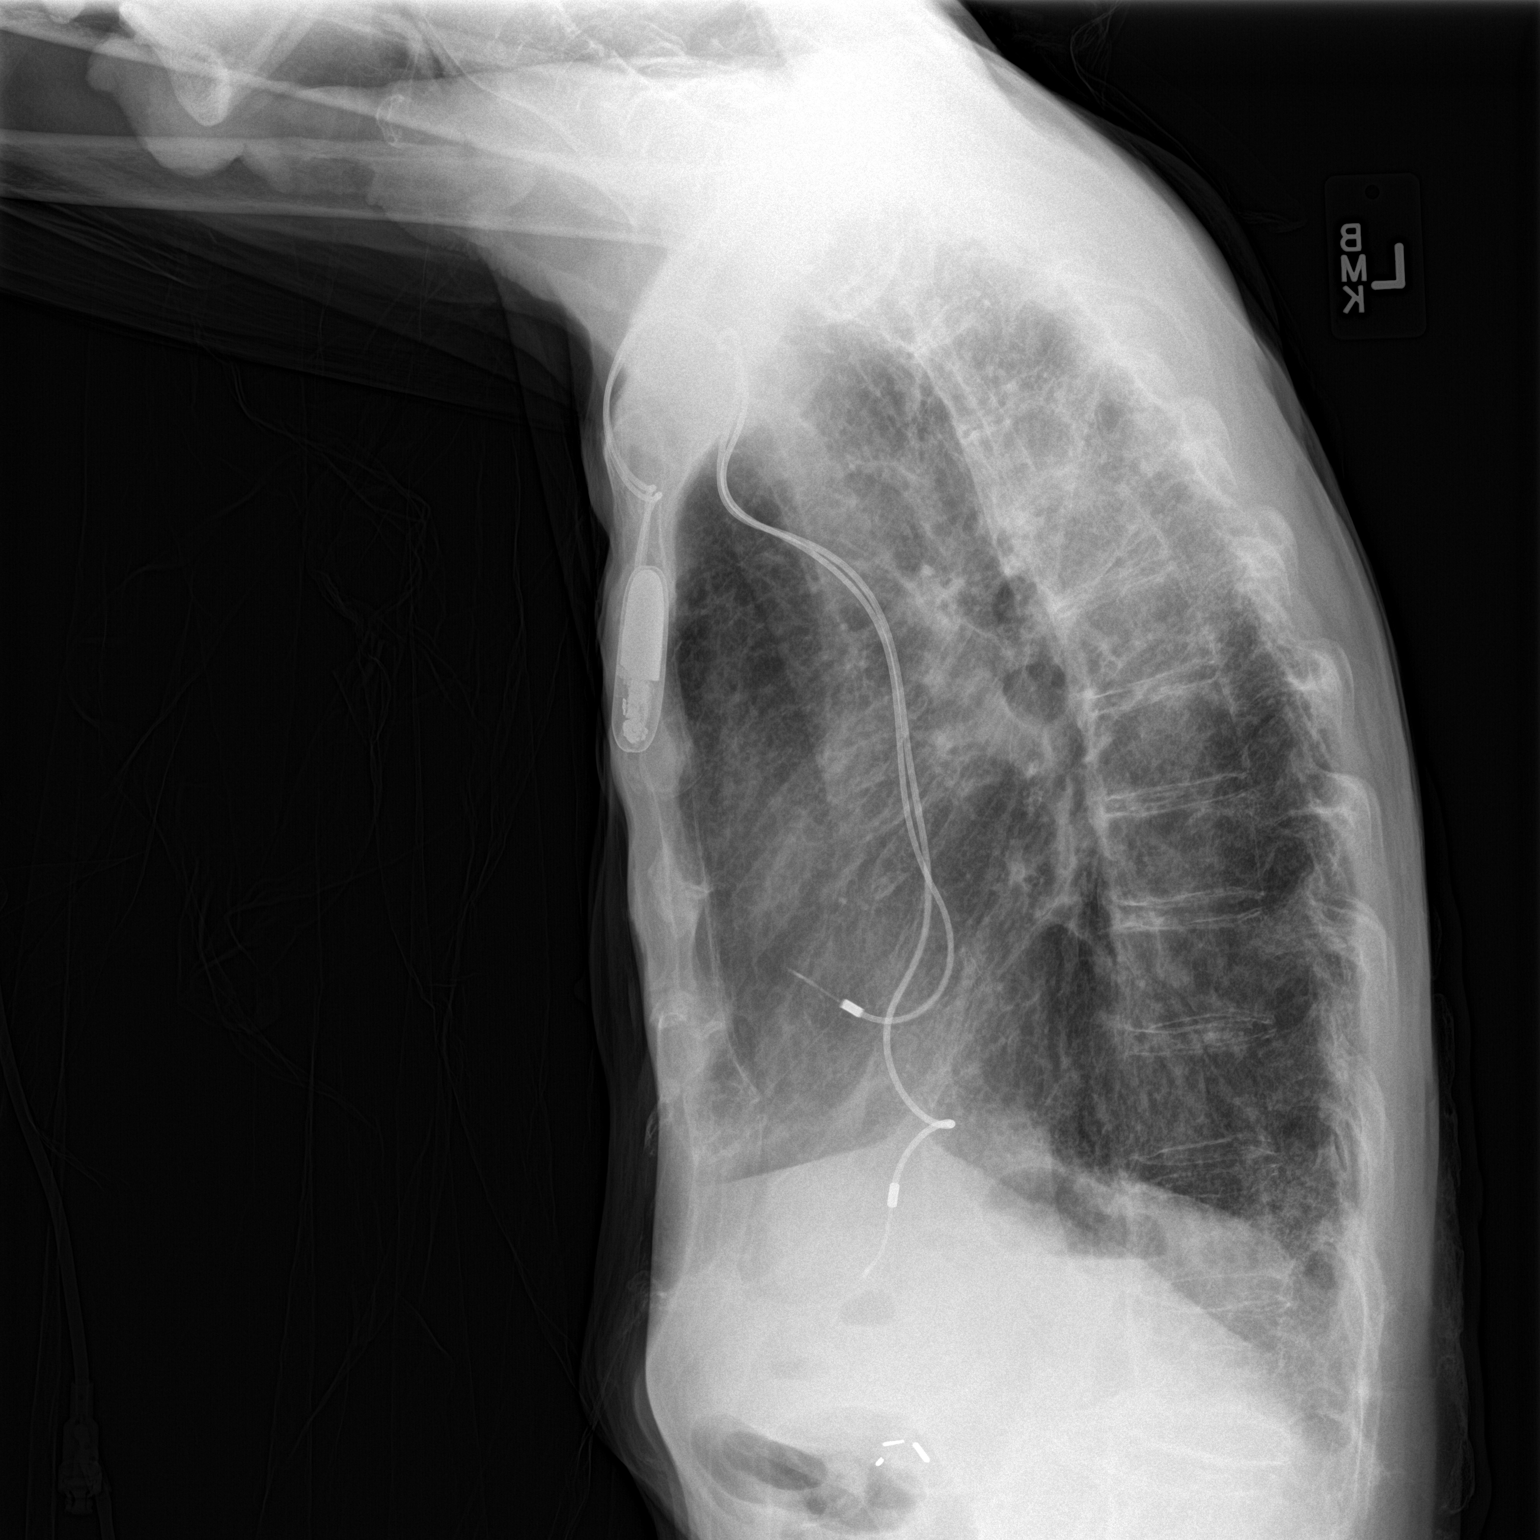

[2 of 2 positions shown; findings below may reference images not displayed]

FINDINGS: There are intact appearances of the transvenous leads. Heart size is
normal and unchanged. Hilar and mediastinal contours are normal and
unchanged. Severe emphysematous and fibrotic changes are again
evident, without superimposed acute infiltrate or congestive
failure. There is no effusion.
IMPRESSION: Severe emphysematous and fibrotic changes without evidence of an
acute superimposed process.

## 2018-06-02 ENCOUNTER — Encounter: Payer: Self-pay | Admitting: General Surgery
